# Patient Record
Sex: Female | Born: 1935 | Race: White | Hispanic: No | Marital: Single | Smoking: Never smoker
Health system: Southern US, Community
[De-identification: ages and names within clinical notes are randomized; demographics above are authoritative.]

## PROBLEM LIST (undated history)

## (undated) DIAGNOSIS — R32 Unspecified urinary incontinence: Secondary | ICD-10-CM

## (undated) DIAGNOSIS — M1990 Unspecified osteoarthritis, unspecified site: Secondary | ICD-10-CM

## (undated) DIAGNOSIS — C8590 Non-Hodgkin lymphoma, unspecified, unspecified site: Secondary | ICD-10-CM

## (undated) DIAGNOSIS — E785 Hyperlipidemia, unspecified: Secondary | ICD-10-CM

## (undated) DIAGNOSIS — I10 Essential (primary) hypertension: Secondary | ICD-10-CM

## (undated) DIAGNOSIS — Z8542 Personal history of malignant neoplasm of other parts of uterus: Secondary | ICD-10-CM

## (undated) DIAGNOSIS — I340 Nonrheumatic mitral (valve) insufficiency: Secondary | ICD-10-CM

## (undated) DIAGNOSIS — Z853 Personal history of malignant neoplasm of breast: Secondary | ICD-10-CM

## (undated) DIAGNOSIS — D649 Anemia, unspecified: Secondary | ICD-10-CM

## (undated) DIAGNOSIS — G4730 Sleep apnea, unspecified: Secondary | ICD-10-CM

## (undated) DIAGNOSIS — K219 Gastro-esophageal reflux disease without esophagitis: Secondary | ICD-10-CM

## (undated) DIAGNOSIS — E119 Type 2 diabetes mellitus without complications: Secondary | ICD-10-CM

## (undated) HISTORY — DX: Sleep apnea, unspecified: G47.30

## (undated) HISTORY — PX: APPENDECTOMY: SHX54

## (undated) HISTORY — DX: Unspecified urinary incontinence: R32

## (undated) HISTORY — PX: BREAST LUMPECTOMY: SHX2

## (undated) HISTORY — DX: Gastro-esophageal reflux disease without esophagitis: K21.9

## (undated) HISTORY — DX: Essential (primary) hypertension: I10

## (undated) HISTORY — DX: Non-Hodgkin lymphoma, unspecified, unspecified site: C85.90

## (undated) HISTORY — DX: Hyperlipidemia, unspecified: E78.5

## (undated) HISTORY — DX: Type 2 diabetes mellitus without complications: E11.9

## (undated) HISTORY — DX: Personal history of malignant neoplasm of other parts of uterus: Z85.42

## (undated) HISTORY — DX: Unspecified osteoarthritis, unspecified site: M19.90

## (undated) HISTORY — DX: Nonrheumatic mitral (valve) insufficiency: I34.0

## (undated) HISTORY — PX: TONSILLECTOMY: SUR1361

## (undated) HISTORY — DX: Anemia, unspecified: D64.9

## (undated) HISTORY — PX: CARDIAC CATHETERIZATION: SHX172

## (undated) HISTORY — DX: Personal history of malignant neoplasm of breast: Z85.3

## (undated) HISTORY — PX: TOTAL ABDOMINAL HYSTERECTOMY W/ BILATERAL SALPINGOOPHORECTOMY: SHX83

---

## 2000-03-17 HISTORY — PX: OTHER SURGICAL HISTORY: SHX169

## 2000-03-17 HISTORY — PX: CARPAL TUNNEL RELEASE: SHX101

## 2001-06-02 HISTORY — PX: ENDOSCOPIC INSERTION PERITONEAL CATHETER PORT: SUR440

## 2004-03-02 ENCOUNTER — Encounter: Payer: Self-pay | Admitting: Internal Medicine

## 2004-03-07 ENCOUNTER — Emergency Department: Payer: Self-pay | Admitting: Emergency Medicine

## 2004-03-13 ENCOUNTER — Ambulatory Visit: Payer: Self-pay | Admitting: Oncology

## 2004-03-18 ENCOUNTER — Ambulatory Visit: Payer: Self-pay | Admitting: Unknown Physician Specialty

## 2004-04-02 ENCOUNTER — Encounter: Payer: Self-pay | Admitting: Internal Medicine

## 2004-04-02 ENCOUNTER — Ambulatory Visit: Payer: Self-pay | Admitting: Oncology

## 2004-05-02 ENCOUNTER — Encounter: Payer: Self-pay | Admitting: Internal Medicine

## 2004-05-15 ENCOUNTER — Ambulatory Visit: Payer: Self-pay | Admitting: Oncology

## 2004-06-02 ENCOUNTER — Encounter: Payer: Self-pay | Admitting: Internal Medicine

## 2004-06-02 ENCOUNTER — Ambulatory Visit: Payer: Self-pay | Admitting: Oncology

## 2004-07-03 ENCOUNTER — Encounter: Payer: Self-pay | Admitting: Internal Medicine

## 2004-07-09 ENCOUNTER — Emergency Department: Payer: Self-pay | Admitting: General Practice

## 2004-07-11 ENCOUNTER — Ambulatory Visit: Payer: Self-pay | Admitting: Oncology

## 2004-07-31 ENCOUNTER — Ambulatory Visit: Payer: Self-pay | Admitting: Oncology

## 2004-07-31 ENCOUNTER — Encounter: Payer: Self-pay | Admitting: Internal Medicine

## 2004-08-31 ENCOUNTER — Encounter: Payer: Self-pay | Admitting: Internal Medicine

## 2004-08-31 ENCOUNTER — Ambulatory Visit: Payer: Self-pay | Admitting: Oncology

## 2004-09-30 ENCOUNTER — Encounter: Payer: Self-pay | Admitting: Internal Medicine

## 2004-09-30 ENCOUNTER — Ambulatory Visit: Payer: Self-pay | Admitting: Oncology

## 2004-10-07 ENCOUNTER — Ambulatory Visit: Payer: Self-pay | Admitting: General Surgery

## 2004-10-31 ENCOUNTER — Encounter: Payer: Self-pay | Admitting: Internal Medicine

## 2004-10-31 ENCOUNTER — Ambulatory Visit: Payer: Self-pay | Admitting: Oncology

## 2004-11-30 ENCOUNTER — Encounter: Payer: Self-pay | Admitting: Internal Medicine

## 2004-12-06 ENCOUNTER — Ambulatory Visit: Payer: Self-pay | Admitting: Oncology

## 2004-12-31 ENCOUNTER — Ambulatory Visit: Payer: Self-pay | Admitting: Oncology

## 2004-12-31 ENCOUNTER — Encounter: Payer: Self-pay | Admitting: Internal Medicine

## 2005-01-31 ENCOUNTER — Encounter: Payer: Self-pay | Admitting: Internal Medicine

## 2005-03-02 ENCOUNTER — Encounter: Payer: Self-pay | Admitting: Internal Medicine

## 2005-03-05 ENCOUNTER — Ambulatory Visit: Payer: Self-pay | Admitting: Oncology

## 2005-04-02 ENCOUNTER — Encounter: Payer: Self-pay | Admitting: Internal Medicine

## 2005-04-02 ENCOUNTER — Ambulatory Visit: Payer: Self-pay | Admitting: Oncology

## 2005-05-02 ENCOUNTER — Ambulatory Visit: Payer: Self-pay | Admitting: Oncology

## 2005-05-02 ENCOUNTER — Encounter: Payer: Self-pay | Admitting: Internal Medicine

## 2005-06-02 ENCOUNTER — Encounter: Payer: Self-pay | Admitting: Internal Medicine

## 2005-06-13 ENCOUNTER — Ambulatory Visit: Payer: Self-pay | Admitting: Oncology

## 2005-06-17 ENCOUNTER — Ambulatory Visit: Payer: Self-pay | Admitting: Oncology

## 2005-07-03 ENCOUNTER — Encounter: Payer: Self-pay | Admitting: Internal Medicine

## 2005-07-04 ENCOUNTER — Ambulatory Visit: Payer: Self-pay | Admitting: Oncology

## 2005-07-31 ENCOUNTER — Encounter: Payer: Self-pay | Admitting: Internal Medicine

## 2005-07-31 ENCOUNTER — Ambulatory Visit: Payer: Self-pay | Admitting: Oncology

## 2005-08-31 ENCOUNTER — Encounter: Payer: Self-pay | Admitting: Internal Medicine

## 2005-09-10 ENCOUNTER — Ambulatory Visit: Payer: Self-pay | Admitting: Oncology

## 2005-09-15 ENCOUNTER — Emergency Department: Payer: Self-pay | Admitting: Emergency Medicine

## 2005-10-02 ENCOUNTER — Ambulatory Visit: Payer: Self-pay | Admitting: General Surgery

## 2005-10-03 ENCOUNTER — Encounter: Payer: Self-pay | Admitting: Internal Medicine

## 2005-10-08 ENCOUNTER — Ambulatory Visit: Payer: Self-pay | Admitting: Oncology

## 2005-10-16 ENCOUNTER — Ambulatory Visit: Payer: Self-pay | Admitting: General Surgery

## 2005-10-31 ENCOUNTER — Ambulatory Visit: Payer: Self-pay | Admitting: Oncology

## 2005-10-31 ENCOUNTER — Encounter: Payer: Self-pay | Admitting: Internal Medicine

## 2005-11-30 ENCOUNTER — Encounter: Payer: Self-pay | Admitting: Internal Medicine

## 2005-11-30 ENCOUNTER — Ambulatory Visit: Payer: Self-pay | Admitting: Oncology

## 2005-12-31 ENCOUNTER — Encounter: Payer: Self-pay | Admitting: Internal Medicine

## 2005-12-31 ENCOUNTER — Ambulatory Visit: Payer: Self-pay | Admitting: Oncology

## 2006-01-31 ENCOUNTER — Encounter: Payer: Self-pay | Admitting: Internal Medicine

## 2006-01-31 ENCOUNTER — Ambulatory Visit: Payer: Self-pay | Admitting: Oncology

## 2006-02-20 ENCOUNTER — Ambulatory Visit: Payer: Self-pay | Admitting: Oncology

## 2006-03-02 ENCOUNTER — Encounter: Payer: Self-pay | Admitting: Internal Medicine

## 2006-03-02 ENCOUNTER — Ambulatory Visit: Payer: Self-pay | Admitting: Oncology

## 2006-04-02 ENCOUNTER — Encounter: Payer: Self-pay | Admitting: Internal Medicine

## 2006-04-09 ENCOUNTER — Ambulatory Visit: Payer: Self-pay | Admitting: Oncology

## 2006-05-02 ENCOUNTER — Ambulatory Visit: Payer: Self-pay | Admitting: Oncology

## 2006-06-02 ENCOUNTER — Ambulatory Visit: Payer: Self-pay | Admitting: Oncology

## 2006-08-01 ENCOUNTER — Ambulatory Visit: Payer: Self-pay | Admitting: Oncology

## 2006-08-26 ENCOUNTER — Ambulatory Visit: Payer: Self-pay | Admitting: Oncology

## 2006-09-01 ENCOUNTER — Ambulatory Visit: Payer: Self-pay | Admitting: Oncology

## 2006-10-08 ENCOUNTER — Ambulatory Visit: Payer: Self-pay | Admitting: Oncology

## 2006-10-14 ENCOUNTER — Ambulatory Visit: Payer: Self-pay | Admitting: General Surgery

## 2006-11-01 ENCOUNTER — Ambulatory Visit: Payer: Self-pay | Admitting: Oncology

## 2006-12-01 ENCOUNTER — Ambulatory Visit: Payer: Self-pay | Admitting: Oncology

## 2007-01-01 ENCOUNTER — Ambulatory Visit: Payer: Self-pay | Admitting: Oncology

## 2007-02-01 ENCOUNTER — Ambulatory Visit: Payer: Self-pay | Admitting: Oncology

## 2007-02-26 ENCOUNTER — Ambulatory Visit: Payer: Self-pay | Admitting: Oncology

## 2007-03-03 ENCOUNTER — Ambulatory Visit: Payer: Self-pay | Admitting: Oncology

## 2007-04-03 ENCOUNTER — Ambulatory Visit: Payer: Self-pay | Admitting: Oncology

## 2007-05-03 ENCOUNTER — Ambulatory Visit: Payer: Self-pay | Admitting: Oncology

## 2007-06-03 ENCOUNTER — Ambulatory Visit: Payer: Self-pay | Admitting: Oncology

## 2007-07-04 ENCOUNTER — Ambulatory Visit: Payer: Self-pay | Admitting: Oncology

## 2007-08-01 ENCOUNTER — Ambulatory Visit: Payer: Self-pay | Admitting: Oncology

## 2007-09-01 ENCOUNTER — Ambulatory Visit: Payer: Self-pay | Admitting: Oncology

## 2007-09-16 ENCOUNTER — Ambulatory Visit: Payer: Self-pay | Admitting: Oncology

## 2007-10-01 ENCOUNTER — Ambulatory Visit: Payer: Self-pay | Admitting: Oncology

## 2007-11-01 ENCOUNTER — Ambulatory Visit: Payer: Self-pay | Admitting: General Surgery

## 2007-11-12 ENCOUNTER — Ambulatory Visit: Payer: Self-pay | Admitting: Oncology

## 2007-12-01 ENCOUNTER — Ambulatory Visit: Payer: Self-pay | Admitting: Oncology

## 2008-01-01 ENCOUNTER — Ambulatory Visit: Payer: Self-pay | Admitting: Oncology

## 2008-02-01 ENCOUNTER — Ambulatory Visit: Payer: Self-pay | Admitting: Oncology

## 2008-02-17 ENCOUNTER — Ambulatory Visit: Payer: Self-pay | Admitting: Oncology

## 2008-02-21 ENCOUNTER — Ambulatory Visit: Payer: Self-pay | Admitting: Oncology

## 2008-03-02 ENCOUNTER — Ambulatory Visit: Payer: Self-pay | Admitting: Oncology

## 2008-07-19 ENCOUNTER — Observation Stay: Payer: Self-pay | Admitting: General Surgery

## 2008-07-31 ENCOUNTER — Ambulatory Visit: Payer: Self-pay | Admitting: Oncology

## 2008-08-21 ENCOUNTER — Ambulatory Visit: Payer: Self-pay | Admitting: Oncology

## 2008-08-31 ENCOUNTER — Ambulatory Visit: Payer: Self-pay | Admitting: Oncology

## 2008-10-31 ENCOUNTER — Ambulatory Visit: Payer: Self-pay | Admitting: Nurse Practitioner

## 2008-11-08 ENCOUNTER — Ambulatory Visit: Payer: Self-pay | Admitting: General Surgery

## 2008-11-27 ENCOUNTER — Ambulatory Visit: Payer: Self-pay | Admitting: Oncology

## 2008-11-30 ENCOUNTER — Ambulatory Visit: Payer: Self-pay | Admitting: Oncology

## 2008-11-30 ENCOUNTER — Ambulatory Visit: Payer: Self-pay | Admitting: Nurse Practitioner

## 2009-04-02 ENCOUNTER — Ambulatory Visit: Payer: Self-pay | Admitting: Oncology

## 2009-04-19 ENCOUNTER — Ambulatory Visit: Payer: Self-pay | Admitting: Oncology

## 2009-05-02 ENCOUNTER — Ambulatory Visit: Payer: Self-pay | Admitting: Oncology

## 2009-06-02 ENCOUNTER — Ambulatory Visit: Payer: Self-pay | Admitting: Oncology

## 2009-07-23 ENCOUNTER — Emergency Department: Payer: Self-pay | Admitting: Emergency Medicine

## 2009-07-27 ENCOUNTER — Ambulatory Visit: Payer: Self-pay | Admitting: Orthopedic Surgery

## 2009-07-31 ENCOUNTER — Ambulatory Visit: Payer: Self-pay | Admitting: Oncology

## 2009-08-30 ENCOUNTER — Ambulatory Visit: Payer: Self-pay | Admitting: Oncology

## 2009-08-31 ENCOUNTER — Ambulatory Visit: Payer: Self-pay | Admitting: Oncology

## 2009-12-04 ENCOUNTER — Ambulatory Visit: Payer: Self-pay | Admitting: General Surgery

## 2009-12-31 ENCOUNTER — Ambulatory Visit: Payer: Self-pay | Admitting: Oncology

## 2010-01-03 ENCOUNTER — Ambulatory Visit: Payer: Self-pay | Admitting: Oncology

## 2010-01-31 ENCOUNTER — Ambulatory Visit: Payer: Self-pay | Admitting: Oncology

## 2010-05-06 ENCOUNTER — Ambulatory Visit: Payer: Self-pay | Admitting: Oncology

## 2010-06-02 ENCOUNTER — Ambulatory Visit: Payer: Self-pay | Admitting: Oncology

## 2010-11-05 ENCOUNTER — Ambulatory Visit: Payer: Self-pay | Admitting: Oncology

## 2010-12-01 ENCOUNTER — Ambulatory Visit: Payer: Self-pay | Admitting: Oncology

## 2011-08-05 ENCOUNTER — Ambulatory Visit: Payer: Self-pay | Admitting: Oncology

## 2011-08-05 LAB — CBC CANCER CENTER
Basophil %: 0.8 %
Eosinophil %: 1.1 %
HCT: 39.3 % (ref 35.0–47.0)
HGB: 13.1 g/dL (ref 12.0–16.0)
Lymphocyte %: 13.4 %
MCH: 28.2 pg (ref 26.0–34.0)
MCHC: 33.3 g/dL (ref 32.0–36.0)
Monocyte #: 0.5 x10 3/mm (ref 0.0–0.7)
Monocyte %: 5.7 %
Neutrophil #: 6.5 x10 3/mm (ref 1.4–6.5)
Neutrophil %: 79 %
RBC: 4.65 10*6/uL (ref 3.80–5.20)
WBC: 8.3 x10 3/mm (ref 3.6–11.0)

## 2011-08-05 LAB — COMPREHENSIVE METABOLIC PANEL
Albumin: 3.5 g/dL (ref 3.4–5.0)
Anion Gap: 7 (ref 7–16)
Calcium, Total: 8.8 mg/dL (ref 8.5–10.1)
Chloride: 103 mmol/L (ref 98–107)
EGFR (African American): >60
EGFR (Non-African Amer.): 57 — ABNORMAL LOW
Osmolality: 284 (ref 275–301)
Potassium: 4.6 mmol/L (ref 3.5–5.1)
SGOT(AST): 16 U/L (ref 15–37)

## 2011-09-01 ENCOUNTER — Ambulatory Visit: Payer: Self-pay | Admitting: Oncology

## 2011-09-05 ENCOUNTER — Inpatient Hospital Stay: Payer: Self-pay | Admitting: Surgery

## 2011-09-05 LAB — CBC
HCT: 42.4 % (ref 35.0–47.0)
MCH: 27.2 pg (ref 26.0–34.0)
MCHC: 32.3 g/dL (ref 32.0–36.0)
Platelet: 224 10*3/uL (ref 150–440)
RDW: 14.2 % (ref 11.5–14.5)
WBC: 10.8 10*3/uL (ref 3.6–11.0)

## 2011-09-05 LAB — COMPREHENSIVE METABOLIC PANEL
BUN: 15 mg/dL (ref 7–18)
Bilirubin,Total: 0.8 mg/dL (ref 0.2–1.0)
Calcium, Total: 8.8 mg/dL (ref 8.5–10.1)
Chloride: 103 mmol/L (ref 98–107)
Co2: 28 mmol/L (ref 21–32)
Creatinine: 0.78 mg/dL (ref 0.60–1.30)
EGFR (Non-African Amer.): >60
Glucose: 125 mg/dL — ABNORMAL HIGH (ref 65–99)
Osmolality: 282 (ref 275–301)
Potassium: 3.9 mmol/L (ref 3.5–5.1)
SGOT(AST): 21 U/L (ref 15–37)
SGPT (ALT): 14 U/L
Sodium: 140 mmol/L (ref 136–145)
Total Protein: 7 g/dL (ref 6.4–8.2)

## 2011-09-05 LAB — LIPASE, BLOOD: Lipase: 79 U/L (ref 73–393)

## 2011-09-05 LAB — TROPONIN I: Troponin-I: <0.02 ng/mL

## 2011-09-11 DIAGNOSIS — R32 Unspecified urinary incontinence: Secondary | ICD-10-CM

## 2011-09-11 DIAGNOSIS — K439 Ventral hernia without obstruction or gangrene: Secondary | ICD-10-CM

## 2011-09-11 DIAGNOSIS — E119 Type 2 diabetes mellitus without complications: Secondary | ICD-10-CM

## 2011-09-11 DIAGNOSIS — I10 Essential (primary) hypertension: Secondary | ICD-10-CM

## 2011-09-12 DIAGNOSIS — F411 Generalized anxiety disorder: Secondary | ICD-10-CM

## 2011-09-12 DIAGNOSIS — F329 Major depressive disorder, single episode, unspecified: Secondary | ICD-10-CM

## 2011-09-14 ENCOUNTER — Observation Stay: Payer: Self-pay | Admitting: Emergency Medicine

## 2011-09-14 LAB — URINALYSIS, COMPLETE
Ketone: NEGATIVE
Nitrite: POSITIVE
Ph: 5 (ref 4.5–8.0)
Specific Gravity: 1.021 (ref 1.003–1.030)
Squamous Epithelial: 2

## 2011-09-14 LAB — LIPASE, BLOOD: Lipase: 209 U/L (ref 73–393)

## 2011-09-14 LAB — COMPREHENSIVE METABOLIC PANEL
Anion Gap: 10 (ref 7–16)
BUN: 22 mg/dL — ABNORMAL HIGH (ref 7–18)
Co2: 28 mmol/L (ref 21–32)
Creatinine: 0.98 mg/dL (ref 0.60–1.30)
EGFR (African American): >60
EGFR (Non-African Amer.): 54 — ABNORMAL LOW
Glucose: 158 mg/dL — ABNORMAL HIGH (ref 65–99)
Potassium: 4.6 mmol/L (ref 3.5–5.1)
Sodium: 139 mmol/L (ref 136–145)
Total Protein: 7.2 g/dL (ref 6.4–8.2)

## 2011-09-14 LAB — CBC
HGB: 13.1 g/dL (ref 12.0–16.0)
MCH: 27.7 pg (ref 26.0–34.0)
MCV: 85 fL (ref 80–100)
RDW: 14.2 % (ref 11.5–14.5)
WBC: 14.5 10*3/uL — ABNORMAL HIGH (ref 3.6–11.0)

## 2011-09-15 ENCOUNTER — Telehealth: Payer: Self-pay | Admitting: Internal Medicine

## 2011-09-15 NOTE — Telephone Encounter (Signed)
Triage Record Num: [id number] Operator: Migdalia Dk Patient Name: Sarah Orozco Call Date & Time: 09/14/2011 4:25:42AM Patient Phone: [phone] PCP: Tillman Abide Patient Gender: Female PCP Fax : [phone] Patient DOB: [birth year] Practice Name: [facility] Reason for Call: Caller: Rozell Searing; PCP: Tillman Abide I.; CB#: [phone]; Call regarding Vomiting ; diarrhea; chest pain; C/o chest pain, also with vomiting and diarrhea. Nurse to send pt. to ER for evaluation. Protocol(s) Used: Chest Pain Recommended Outcome per Protocol: Activate EMS 911 Reason for Outcome: Pressure, fullness, squeezing sensation or pain anywhere in the chest lasting 5 or more minutes now or within the last hour. Pain is NOT associated with taking a deep breath or a productive cough, movement, or touch to a localized area on the chest. Care Advice: ~ 09/14/2011 4:28:49AM Page 1 of 1 [id number]

## 2011-09-15 NOTE — Telephone Encounter (Signed)
Was admitted  Will await further info from [facility]

## 2011-09-18 DIAGNOSIS — R1084 Generalized abdominal pain: Secondary | ICD-10-CM

## 2011-09-18 DIAGNOSIS — F411 Generalized anxiety disorder: Secondary | ICD-10-CM

## 2011-09-24 DIAGNOSIS — I10 Essential (primary) hypertension: Secondary | ICD-10-CM

## 2011-10-01 ENCOUNTER — Ambulatory Visit: Payer: Self-pay | Admitting: Surgery

## 2011-10-21 ENCOUNTER — Ambulatory Visit: Payer: Self-pay | Admitting: Surgery

## 2011-10-23 DIAGNOSIS — R070 Pain in throat: Secondary | ICD-10-CM

## 2011-11-03 LAB — PATHOLOGY REPORT

## 2011-12-04 ENCOUNTER — Emergency Department: Payer: Self-pay | Admitting: Emergency Medicine

## 2011-12-05 LAB — URINALYSIS, COMPLETE
Bilirubin,UR: NEGATIVE
Glucose,UR: NEGATIVE mg/dL (ref 0–75)
Ph: 5 (ref 4.5–8.0)
Protein: NEGATIVE
RBC,UR: 2 /HPF (ref 0–5)
Specific Gravity: 1.014 (ref 1.003–1.030)
Squamous Epithelial: 1
WBC UR: 100 /HPF (ref 0–5)

## 2011-12-05 LAB — COMPREHENSIVE METABOLIC PANEL
Anion Gap: 6 — ABNORMAL LOW (ref 7–16)
Calcium, Total: 8.9 mg/dL (ref 8.5–10.1)
Chloride: 100 mmol/L (ref 98–107)
EGFR (African American): >60
EGFR (Non-African Amer.): >60
Glucose: 159 mg/dL — ABNORMAL HIGH (ref 65–99)
Potassium: 4.3 mmol/L (ref 3.5–5.1)
SGOT(AST): 16 U/L (ref 15–37)
Total Protein: 6.8 g/dL (ref 6.4–8.2)

## 2011-12-05 LAB — CBC
HCT: 36.6 % (ref 35.0–47.0)
MCH: 26.6 pg (ref 26.0–34.0)
MCHC: 32 g/dL (ref 32.0–36.0)
MCV: 83 fL (ref 80–100)
Platelet: 228 10*3/uL (ref 150–440)
RDW: 15 % — ABNORMAL HIGH (ref 11.5–14.5)
WBC: 12.3 10*3/uL — ABNORMAL HIGH (ref 3.6–11.0)

## 2011-12-07 LAB — URINE CULTURE

## 2011-12-07 LAB — STOOL CULTURE

## 2011-12-29 ENCOUNTER — Ambulatory Visit: Payer: Self-pay | Admitting: Oncology

## 2011-12-29 LAB — COMPREHENSIVE METABOLIC PANEL
Albumin: 3.2 g/dL — ABNORMAL LOW (ref 3.4–5.0)
Alkaline Phosphatase: 103 U/L (ref 50–136)
Calcium, Total: 9.2 mg/dL (ref 8.5–10.1)
Co2: 30 mmol/L (ref 21–32)
EGFR (African American): >60
Glucose: 190 mg/dL — ABNORMAL HIGH (ref 65–99)
SGOT(AST): 19 U/L (ref 15–37)
SGPT (ALT): 25 U/L

## 2011-12-29 LAB — CBC CANCER CENTER
Basophil #: 0 x10 3/mm (ref 0.0–0.1)
Eosinophil #: 0.2 x10 3/mm (ref 0.0–0.7)
Lymphocyte #: 1.2 x10 3/mm (ref 1.0–3.6)
MCH: 26.5 pg (ref 26.0–34.0)
MCV: 82 fL (ref 80–100)
Monocyte #: 0.5 x10 3/mm (ref 0.2–0.9)
Monocyte %: 6 %
Platelet: 163 x10 3/mm (ref 150–440)
RDW: 15.2 % — ABNORMAL HIGH (ref 11.5–14.5)
WBC: 8.7 x10 3/mm (ref 3.6–11.0)

## 2012-01-01 ENCOUNTER — Ambulatory Visit: Payer: Self-pay | Admitting: Oncology

## 2012-01-02 ENCOUNTER — Ambulatory Visit: Payer: Self-pay | Admitting: Physician Assistant

## 2012-01-03 ENCOUNTER — Ambulatory Visit: Payer: Self-pay | Admitting: Oncology

## 2012-02-16 ENCOUNTER — Other Ambulatory Visit: Payer: Self-pay | Admitting: *Deleted

## 2012-02-16 NOTE — Telephone Encounter (Signed)
I think she was at [facility] but already left They need to send any requests to her usual primary care doc

## 2012-02-16 NOTE — Telephone Encounter (Signed)
Faxed request asking for [facility], is this your patient?

## 2012-02-17 NOTE — Telephone Encounter (Signed)
ok 

## 2012-03-03 ENCOUNTER — Ambulatory Visit: Payer: Self-pay | Admitting: Internal Medicine

## 2012-06-02 ENCOUNTER — Ambulatory Visit: Payer: Self-pay | Admitting: Oncology

## 2012-10-21 ENCOUNTER — Ambulatory Visit: Payer: Self-pay | Admitting: Surgery

## 2012-11-01 ENCOUNTER — Ambulatory Visit: Payer: Self-pay | Admitting: Oncology

## 2013-06-01 ENCOUNTER — Ambulatory Visit: Payer: Self-pay | Admitting: Ophthalmology

## 2013-06-01 DIAGNOSIS — I10 Essential (primary) hypertension: Secondary | ICD-10-CM

## 2013-06-30 ENCOUNTER — Ambulatory Visit: Payer: Self-pay | Admitting: Ophthalmology

## 2013-06-30 LAB — POTASSIUM: Potassium: 4 mmol/L (ref 3.5–5.1)

## 2013-07-04 ENCOUNTER — Ambulatory Visit: Payer: Self-pay | Admitting: Ophthalmology

## 2013-08-11 ENCOUNTER — Ambulatory Visit: Payer: Self-pay | Admitting: Physician Assistant

## 2013-12-09 ENCOUNTER — Encounter: Payer: Self-pay | Admitting: *Deleted

## 2013-12-12 ENCOUNTER — Encounter: Payer: Self-pay | Admitting: *Deleted

## 2013-12-12 ENCOUNTER — Ambulatory Visit: Payer: PRIVATE HEALTH INSURANCE | Admitting: Cardiovascular Disease

## 2014-02-27 ENCOUNTER — Ambulatory Visit: Payer: Self-pay | Admitting: Physician Assistant

## 2014-03-10 ENCOUNTER — Ambulatory Visit: Payer: PRIVATE HEALTH INSURANCE | Admitting: Cardiovascular Disease

## 2014-03-16 ENCOUNTER — Emergency Department: Payer: Self-pay | Admitting: Internal Medicine

## 2014-06-16 ENCOUNTER — Ambulatory Visit: Payer: PRIVATE HEALTH INSURANCE | Admitting: Cardiovascular Disease

## 2014-07-03 ENCOUNTER — Encounter: Payer: Self-pay | Admitting: Cardiovascular Disease

## 2014-07-03 ENCOUNTER — Ambulatory Visit (INDEPENDENT_AMBULATORY_CARE_PROVIDER_SITE_OTHER): Payer: Medicare Other | Admitting: Cardiovascular Disease

## 2014-07-03 ENCOUNTER — Encounter (INDEPENDENT_AMBULATORY_CARE_PROVIDER_SITE_OTHER): Payer: Self-pay

## 2014-07-03 DIAGNOSIS — E1165 Type 2 diabetes mellitus with hyperglycemia: Secondary | ICD-10-CM | POA: Insufficient documentation

## 2014-07-03 DIAGNOSIS — I10 Essential (primary) hypertension: Secondary | ICD-10-CM

## 2014-07-03 DIAGNOSIS — I5032 Chronic diastolic (congestive) heart failure: Secondary | ICD-10-CM

## 2014-07-03 DIAGNOSIS — R0602 Shortness of breath: Secondary | ICD-10-CM

## 2014-07-03 DIAGNOSIS — G4733 Obstructive sleep apnea (adult) (pediatric): Secondary | ICD-10-CM

## 2014-07-03 DIAGNOSIS — I159 Secondary hypertension, unspecified: Secondary | ICD-10-CM

## 2014-07-03 DIAGNOSIS — E119 Type 2 diabetes mellitus without complications: Secondary | ICD-10-CM

## 2014-07-03 DIAGNOSIS — R32 Unspecified urinary incontinence: Secondary | ICD-10-CM

## 2014-07-03 MED ORDER — HYDROCHLOROTHIAZIDE 25 MG PO TABS: 25.0000 mg | ORAL_TABLET | Freq: Every day | ORAL | Status: DC

## 2014-07-03 NOTE — Assessment & Plan Note (Signed)
Currently not on her see Pap. Prior echocardiogram with mild pulmonary hypertension. We'll start HCTZ

## 2014-07-03 NOTE — Assessment & Plan Note (Signed)
Chronic shortness of breath. She is sedentary, deconditioned, obese. Also likely has chronic diastolic CHF.

## 2014-07-03 NOTE — Progress Notes (Signed)
Patient ID: Sarah Orozco, female    DOB: [birth year], 81 y.o.   MRN: [id number]  HPI Comments: Sarah Orozco is a pleasant 81 year old woman with history of normal ejection fraction, chronic lower extremity edema, hypertension, incontinence and wears a diaper, diabetes, hyperlipidemia, breast cancer and uterine cancer, obstructive sleep apnea, non-Hodgkin's lymphoma with bulky disease of the right subclavian region who presents to establish care in the [place] office.  She reports that she is unable to walk very far. She can walk short distances with a walker but needs assistance. She has chronic lower extremity edema but this has been stable. She is able to work with physical therapy but reports recently blood pressure has been excessively high and on those occasions they will not exercise her. She does not take a diuretic and has not done so for quite some time. Notes from cardiology in July 2014 shows she was on Lasix 20 mg daily. She wears compression hose daily, significant ankle tenderness Patient believes she has diabetes but is currently not on any diabetes medications. Hemoglobin A1c in mid 2015 was 7.8  Prior echocardiogram June 2014 showing normal LV systolic function, significant mitral, tricuspid regurgitation, biatrial enlargement, study read by outside cardiologist  Blood pressure has been running high, typically 150s up to 867E systolic EKG shows normal sinus rhythm with rate 66 bpm, no significant ST or T-wave changes last cholesterol measurements 186, LDL 96, triglycerides 264, glucose level 248      No Known Allergies  Outpatient Encounter Prescriptions as of 07/03/2014  Medication Sig  . acetaminophen (TYLENOL) 325 MG tablet Take 325 mg by mouth every 6 (six) hours as needed.   . ALPRAZolam (XANAX) 0.25 MG tablet Take 0.25 mg by mouth 2 (two) times daily as needed for anxiety.  Marland Kitchen amoxicillin (AMOXIL) 500 MG capsule Take 4 tablets 1 hour prior to dental procedures.  .  Calcium Carbonate-Vitamin D (CALCIUM 600+D) 600-400 MG-UNIT per tablet Take 1 tablet by mouth 2 (two) times daily.  . Elastic Bandages & Supports (T.E.D. BELOW KNEE/S-REGULAR) MISC by Does not apply route.  Marland Kitchen losartan (COZAAR) 50 MG tablet Take 50 mg by mouth.   . Lutein 6 MG CAPS Take by mouth.  . meloxicam (MOBIC) 7.5 MG tablet Take 7.5 mg by mouth daily.  . miconazole (MICOTIN) 2 % cream Apply 1 application topically 2 (two) times daily.  . Multiple Vitamins-Minerals (THERAGRAN-M PREMIER 50 PLUS PO) Take by mouth daily.  Marland Kitchen Petrolatum-Zinc Oxide 49-15 % OINT Apply topically 2 (two) times daily.  . sertraline (ZOLOFT) 100 MG tablet Take 100 mg by mouth daily.  . [DISCONTINUED] traMADol (ULTRAM) 50 MG tablet Take by mouth every 6 (six) hours as needed.  . hydrochlorothiazide (HYDRODIURIL) 25 MG tablet Take 1 tablet (25 mg total) by mouth daily.  . [DISCONTINUED] Calcium Carb-Cholecalciferol (CALTRATE 600+D) 600-800 MG-UNIT TABS Take by mouth 2 (two) times daily.  . [DISCONTINUED] ciprofloxacin (CIPRO) 250 MG tablet Take 250 mg by mouth 2 (two) times daily.  . [DISCONTINUED] furosemide (LASIX) 20 MG tablet Take 20 mg by mouth 2 (two) times daily.  . [DISCONTINUED] glipiZIDE (GLUCOTROL XL) 2.5 MG 24 hr tablet Take 2.5 mg by mouth daily with breakfast.  . [DISCONTINUED] losartan (COZAAR) 25 MG tablet Take 25 mg by mouth daily.  . [DISCONTINUED] oxybutynin (DITROPAN) 5 MG tablet Take 5 mg by mouth 2 (two) times daily.  . [DISCONTINUED] potassium chloride (K-DUR) 10 MEQ tablet Take 10 mEq by mouth daily.    Past  Medical History  Diagnosis Date  . Mitral valve insufficiency   . Diabetes mellitus without complication   . Sleep apnea   . Hyperlipidemia   . Hypertension   . History of breast cancer   . Anemia   . Urinary incontinence   . Osteoarthritis   . GERD (gastroesophageal reflux disease)   . History of uterine cancer   . Non-Hodgkin's lymphoma   . Incontinence     Past Surgical  History  Procedure Laterality Date  . Total abdominal hysterectomy w/ bilateral salpingoophorectomy    . Breast lumpectomy      left   . Tonsillectomy    . Appendectomy    . Knee replacement  03/17/2000    right knee   . Carpal tunnel release  03/17/2000  . Endoscopic insertion peritoneal catheter port  2003  . Cardiac catheterization      Social History  reports that she has never smoked. She does not have any smokeless tobacco history on file. She reports that she does not drink alcohol or use illicit drugs.  Family History Family history is unknown by patient.   Review of Systems  Constitutional: Negative.   Respiratory: Negative.   Cardiovascular: Negative.   Gastrointestinal: Negative.   Musculoskeletal: Negative.   Allergic/Immunologic: Negative.   Neurological: Negative.   Hematological: Negative.   Psychiatric/Behavioral: Negative.   All other systems reviewed and are negative.   BP 160/80 mmHg  Pulse 66  Ht 5' 4.5" (1.638 m)  Wt 206 lb (93.441 kg)  BMI 34.83 kg/m2  Physical Exam  Constitutional: She is oriented to person, place, and time. She appears well-developed and well-nourished.  HENT:  Head: Normocephalic.  Nose: Nose normal.  Mouth/Throat: Oropharynx is clear and moist.  Eyes: Conjunctivae are normal. Pupils are equal, round, and reactive to light.  Neck: Normal range of motion. Neck supple. No JVD present.  Cardiovascular: Normal rate, regular rhythm, S1 normal, S2 normal, normal heart sounds and intact distal pulses.  Exam reveals no gallop and no friction rub.   No murmur heard. Pulmonary/Chest: Effort normal and breath sounds normal. No respiratory distress. She has no wheezes. She has no rales. She exhibits no tenderness.  Abdominal: Soft. Bowel sounds are normal. She exhibits no distension. There is no tenderness.  Musculoskeletal: Normal range of motion. She exhibits no edema or tenderness.  Lymphadenopathy:    She has no cervical  adenopathy.  Neurological: She is alert and oriented to person, place, and time. Coordination normal.  Skin: Skin is warm and dry. No rash noted. No erythema.  Psychiatric: She has a normal mood and affect. Her behavior is normal. Judgment and thought content normal.    Assessment and Plan  Nursing note and vitals reviewed.

## 2014-07-03 NOTE — Assessment & Plan Note (Signed)
Blood pressure is elevated, also mild lower extremity edema, abdominal bloating. She used to be on Lasix 20 mg daily. Recommended she start HCTZ 25 mg daily. We'll check basic metabolic panel in 3 weeks time.

## 2014-07-03 NOTE — Assessment & Plan Note (Signed)
Blood pressure continues to run 440 up to 347 systolic on a regular basis. We'll start HCTZ. We'll avoid calcium channel blockers. She is already taking losartan. This could be increased up to 100 mg daily if needed.

## 2014-07-03 NOTE — Patient Instructions (Addendum)
Please stop the tramadol, (side effects) Please start HCTZ 25 mg once a day  We will order BMP in 3 weeks  Lab work today (HBA1C, BMP)  Please call the office if blood pressure continues to run high  Please call us if you have new issues that need to be addressed before your next appt.  Your physician wants you to follow-up in: 3 months.  You will receive a reminder letter in the mail two months in advance. If you don't receive a letter, please call our office to schedule the follow-up appointment.

## 2014-07-03 NOTE — Assessment & Plan Note (Signed)
She is unable to exercise. Recommended diet changes, portion control

## 2014-07-03 NOTE — Assessment & Plan Note (Signed)
She reports that she was previously on glipizide. 6 months ago hemoglobin A1c 7.8. We will recheck labs today

## 2014-07-04 LAB — BASIC METABOLIC PANEL
BUN/Creatinine Ratio: 23 (ref 11–26)
BUN: 19 mg/dL (ref 8–27)
CO2: 27 mmol/L (ref 18–29)
Calcium: 9.7 mg/dL (ref 8.7–10.3)
Chloride: 98 mmol/L (ref 97–108)
Creatinine, Ser: 0.82 mg/dL (ref 0.57–1.00)
GFR calc Af Amer: 76 mL/min/{1.73_m2} (ref >59)
GFR, EST NON AFRICAN AMERICAN: 66 mL/min/{1.73_m2} (ref >59)
Glucose: 158 mg/dL — ABNORMAL HIGH (ref 65–99)
Potassium: 4.6 mmol/L (ref 3.5–5.2)
Sodium: 141 mmol/L (ref 134–144)

## 2014-07-04 LAB — HEMOGLOBIN A1C
Est. average glucose Bld gHb Est-mCnc: 166 mg/dL
HEMOGLOBIN A1C: 7.4 % — AB (ref 4.8–5.6)

## 2014-09-23 NOTE — Op Note (Signed)
PATIENT NAME:  Sarah Orozco, Sarah Orozco MR#:  [id number] DATE OF BIRTH:  13-Mar-1928  DATE OF PROCEDURE:  07/04/2013  PREOPERATIVE DIAGNOSIS: Cataract, left eye.   POSTOPERATIVE DIAGNOSIS: Cataract, left eye.   PROCEDURE PERFORMED: Extracapsular cataract extraction using phacoemulsification with placement Alcon [id number], 21.0-diopter posterior chamber lens, serial number [id number].   SURGEON: Loura Back. Johnthan Axtman, M.D.   ANESTHESIA: 4% lidocaine and 0.75% Marcaine a 50-50 mixture with 10 units/mL of HyoMax added, given as a peribulbar.   ANESTHESIOLOGIST: Dr. Ronelle Nigh.   COMPLICATIONS: None.   ESTIMATED BLOOD LOSS: Less than 1 mL.   DESCRIPTION OF PROCEDURE: The patient was brought to the operating room and given a peribulbar block.  The patient was then prepped and draped in the usual fashion.  The vertical rectus muscles were imbricated using 5-0 silk sutures.  These sutures were then clamped to the sterile drapes as bridle sutures.  A limbal peritomy was performed extending two clock hours and hemostasis was obtained with cautery.  A partial thickness scleral groove was made at the surgical limbus and dissected anteriorly in a lamellar dissection using an Alcon crescent knife.  The anterior chamber was entered supero-temporally with a Superblade and through the lamellar dissection with a 2.6 mm keratome.  DisCoVisc was used to replace the aqueous and a continuous tear capsulorrhexis was carried out.  Hydrodissection and hydrodelineation were carried out with balanced salt and a 27 gauge canula.  The nucleus was rotated to confirm the effectiveness of the hydrodissection.  Phacoemulsification was carried out using a divide-and-conquer technique.  Total ultrasound time was 1 minute and 10.5 seconds with an average power of  24.7%. CDE 27.23.  Irrigation/aspiration was used to remove the residual cortex.  DisCoVisc was used to inflate the capsule and the internal incision was enlarged to 3 mm with the  crescent knife.  The intraocular lens was folded and inserted into the capsular bag using the AcrySert delivery system.  Irrigation/aspiration was used to remove the residual DisCoVisc.  Miostat was injected into the anterior chamber through the paracentesis track to inflate the anterior chamber and induce miosis.  A tenth of a mL of cefuroxime was injected via the paracentesis track containing 1 mg of drug. The wound was checked for leaks and none were found. The conjunctiva was closed with cautery and the bridle sutures were removed.  Two drops of 0.3% Vigamox were placed on the eye.   An eye shield was placed on the eye.  The patient was discharged to the recovery room in good condition.  ____________________________ Loura Back Dijon Kohlman, MD sad:aw D: 07/04/2013 12:40:52 ET T: 07/04/2013 14:24:29 ET JOB#: [id number]  cc: Remo Lipps A. Sharmain Lastra, MD, <Dictator> Martie Lee MD ELECTRONICALLY SIGNED 07/11/2013 10:14

## 2014-09-24 NOTE — H&P (Signed)
PATIENT NAME:  Sarah Orozco, HARTER MR#:  [id number] DATE OF BIRTH:  Dec 08, 1927  DATE OF ADMISSION:  09/05/2011  HISTORY OF PRESENT ILLNESS:  This 81 year old female came in with a chief complaint of a painful hernia. She reports she has had a large ventral hernia for a number of years, but has had much pain in the hernia today. She reports that when she was getting ready to come to the Emergency Room she vomited, has had some nausea in the Emergency Room, but has not thrown up since she reached the Emergency Room. She reports that she has history of alternating constipation and diarrhea, has been passing some flatus per rectum today. She reports no chills or fever. She was initially evaluated by the Emergency Room staff and found to have an incarcerated ventral hernia, also had CT scan which demonstrated segment of the transverse colon within the hernia. Also, there were multiple loops of small bowel in the pelvis and the abdomen with mild bowel wall thickening, nonspecific stranding in the mesentery.   PAST MEDICAL HISTORY:  1. Carcinoma of the left breast and has had a partial mastectomy. Has had radiation.  2. She reports a history of carcinoma of the right breast although on exam I cannot identify the scar in the right breast.  3. There is a history of lymphoma, has been under the care of Dr. Oliva Bustard and has had chemotherapy. Has had a Port-A-Cath on the right which has been removed.  4. She has a history of uterine cancer and total abdominal hysterectomy, bilateral salpingo-oophorectomy.  5. Non-insulin-dependent diabetes mellitus. 6. Hypertension. 7. She reports some valvular heart disease, but no history of heart failure. No history of myocardial infarction.  8. She has a distant history of sleep apnea.  9. Arthritis. 10. Hiatus hernia.  11. Upper endoscopy in the past. 12. Colonoscopy. She has had colon polyps removed. She says it has been a number of years since she has had a colonoscopy.    PAST SURGICAL HISTORY:  1. Left partial mastectomy. 2. Total abdominal hysterectomy and bilateral salpingo-oophorectomy. 3. Appendectomy.  4. She reports the above-mentioned colonoscopy.   FAMILY HISTORY: Positive for colon cancer, breast cancer, uterine cancer, diabetes, hypertension, strokes.  SOCIAL HISTORY: She lives locally and does go to [facility] during the daytime. Does not smoke. Does not drink any alcohol.   MEDICATIONS:  1. Glipizide 2.5 mg daily. 2. Lisinopril 10 mg daily.  3. Prilosec 20 mg daily. 4. Sertraline 50 mg daily.  5. Calcium 600 + D two times per day.  6. Imodium AD 2 mg p.r.n. 7. Multiple vitamin.  8. Ocuvite. 9. Oxybutynin 5 mg tablet 2 times per day.   DRUG ALLERGIES: None known. She does report an allergy to adhesive.   REVIEW OF SYSTEMS: She also reports having a mild headache tonight. Also has some acute and chronic neck pain. Just recently had two teeth pulled. She reports no current cough, cold, or sore throat. No dyspnea. She reports no recent exertional chest pains. She reports no recent sores or boils. She does sometimes get ankle edema, but not at present. She reports alternating constipation and diarrhea. No rectal bleeding. She has been incontinent of stool and also incontinent of urine. She is not having any dysuria. She reports just limited amount of walking. Review of systems otherwise negative.   PHYSICAL EXAMINATION:  VITAL SIGNS: Temperature 97.1, pulse 61, respirations 18, blood pressure 176/86, pulse oximetry 98%. Pain scale initially was an 8 and  was less painful by the time of surgical consultation. The patient reports she had received some analgesic, although I do not see that recorded on record.   GENERAL: She was seen on the Emergency Room stretcher.   VITAL SIGNS: Height 66 inches, weight 260 pounds, BMI of 42, pulse ox 98%.   SKIN: Warm and dry without rash.   HEENT: Pupils equal and reactive to light. Extraocular movements  are intact. Sclerae clear. Pharynx clear.   NECK: Supple with no palpable mass.   LUNGS: Lung sounds are clear. No respiratory distress.   HEART: Regular rhythm, S1 and S2 without murmur.   ABDOMEN: Obese. There is a localized palpable tender mass in the right lower abdomen. This is just to the right of the midline and below the level of the umbilicus. This mass was found to be approximately 12 cm in diameter. There was no other palpable mass in the abdomen. No other area of tenderness. This was manipulated and moderately tender and over a period of approximately three minutes of manipulation, could eventually be reduced, but then quickly started to herniate again.   RECTAL: Demonstrated no palpable mass. There is weak sphincter tone.   EXTREMITIES: No dependent edema.   NEUROLOGIC: Awake, alert, and oriented, moving all extremities.   CLINICAL DATA: Metabolic panel C with a glucose of 125, albumin slightly low at 3.3, otherwise normal. Troponin less than 0.2. White blood count 10,800, hemoglobin 13.7.   I reviewed her CT images which do demonstrate a ventral hernia, does contain transverse colon. There is a large gallstone. There did not appear to be any gallbladder wall thickening.   IMPRESSION: Transanally incarcerated ventral hernia with associated vomiting.   RECOMMENDATIONS: I recommend admission to the hospital, keep her at bed rest, give her IV fluids for hydration, and will plan to repair the hernia in the morning. I discussed the     operation, care, risks, and benefits with her in detail, will use a preop prophylactic antibiotic and may possibly use prosthetic mesh to reinforce the repair.   ____________________________ J. Rochel Brome, MD jws:ap D: 09/05/2011 21:08:58 ET T: 09/06/2011 08:03:39 ET JOB#: [id number]  cc: Loreli Dollar, MD, <Dictator> Loreli Dollar MD ELECTRONICALLY SIGNED 09/06/2011 16:56

## 2014-09-24 NOTE — Op Note (Signed)
PATIENT NAME:  Sarah Orozco, Sarah Orozco MR#:  [id number] DATE OF BIRTH:  1927/10/20  DATE OF PROCEDURE:  09/06/2011  PREOPERATIVE DIAGNOSIS: Incarcerated ventral hernia.   POSTOPERATIVE DIAGNOSIS: Incarcerated ventral hernia.    PROCEDURE: Ventral hernia repair.   SURGEON: Rochel Brome, M.D.   ANESTHESIA: General.   INDICATIONS: This 81 year old female has a past history of hysterectomy. She has a longstanding history of ventral hernia and has developed pain at this site with associated nausea and vomiting. This was partially reducible in the Emergency Room, however could not be reduced after induction of anesthesia.   DESCRIPTION OF PROCEDURE: The patient was placed on the operating table in the supine position under general endotracheal anesthesia. The abdomen was prepared with ChloraPrep and draped in a sterile manner. The mass was approximately 12 to 15 cm in dimension just to the right of the midline below the umbilicus. A longitudinally oriented incision was made some 14 cm in length in the midline at the site of an old scar and carried down through subcutaneous tissues and encountered a very thin-walled ventral hernia sac which was dissected free from surrounding structures. This was a somewhat tedious dissection. There was colon within the hernia. The fascial ring defect was delineated and was approximately 5 cm in diameter. There was also some fatty tissue within the sac itself. At this point it could not be reduced and it was necessary to enlarge the fascial defect laterally and medially in both directions by about 1.5 cm on each side and I could subsequently get the hernia reduced. There was no visible posterior rectus sheath although rectus muscle was identified. The peritoneum was closed with a running 2-0 chromic suture. Next, an Atrium mesh was selected and cut to create an oval shape of some 5 x 7 cm and this was transversely oriented and deep to the anterior rectus sheath. It was sutured to the  overlying fascia circumferentially with interrupted 0 Surgilon sutures. Next, the fascia was closed over the mesh incorporating mesh into each suture with a transversely oriented suture line of interrupted 0 Surgilon figure-of-eight sutures. It is noted that during the course of the procedure a number of small bleeding points were cauterized. Hemostasis was subsequently intact. Next, scarred subcutaneous tissue was approximated in the midline with interrupted 2-0 chromic. The skin was closed with interrupted 4-0 nylon vertical mattress sutures. Dressings were applied with paper tape. The patient tolerated surgery satisfactorily and was then moved to the recovery room for postoperative care.  ____________________________ J. Rochel Brome, MD jws:ap D: 09/06/2011 15:02:52 ET T: 09/07/2011 14:22:25 ET JOB#: [id number]  cc: Loreli Dollar, MD, <Dictator> Loreli Dollar MD ELECTRONICALLY SIGNED 09/09/2011 18:15

## 2014-09-24 NOTE — Discharge Summary (Signed)
PATIENT NAME:  Sarah Orozco, Sarah Orozco MR#:  [id number] DATE OF BIRTH:  [birth year]  DATE OF ADMISSION:  09/05/2011 DATE OF DISCHARGE:  09/09/2011  HISTORY OF PRESENT ILLNESS: This 81 year old female came in with chief complaint of a painful hernia. She had had a large ventral hernia for some 10 years but prior to admission developed pain at the site, also had nausea and vomiting, came into the Emergency Room where she was initially evaluated by the Emergency Room staff and had CT scan which I viewed which demonstrated transverse colon within large hernia in the right lower abdomen. This was the site of her pain.   PAST MEDICAL HISTORY:  1. Carcinoma of the left breast and left partial mastectomy and radiation many years ago. She reported a history of right breast cancer in the Emergency Room, however, records indicate she has not had cancer of the right breast by has been followed by Dr. Bary Castilla and Dr. Oliva Bustard and there is concern about a nodule in the left breast which according to the patient is cancer and she is having to make a decision about which direction to take.  2. She reports history of uterine cancer and total abdominal hysterectomy, bilateral salpingo-oophorectomy.  3. She has a history of lymphoma, has been treated by Dr. Oliva Bustard, continues to be followed by Dr. Oliva Bustard.  4. Non-insulin-dependent diabetes mellitus. 5. Hypertension.  6. History of valvular heart disease. No history of heart failure.  7. Distant history of sleep apnea.  8. Arthritis.  9. Hiatus hernia.  10. History of endoscopy. 11. History of colonoscopy with polyps removed.   MEDICATIONS:  1. Glipizide 2.5 mg daily.  2. Lisinopril 10 mg daily.  3. Prilosec 20 mg daily. 4. Sertraline 50 mg daily.  5. Calcium 600+ D b.i.d.  6. Imodium AD 2 mg p.r.n. for diarrhea. 7. Multiple vitamin daily.  8. Ocuvite daily.  9. Oxybutynin 5 mg 2 times per day.   DRUG ALLERGIES: None known.   PHYSICAL EXAMINATION: VITAL SIGNS:  Temperature 97.1, pulse 61, respirations 18, blood pressure 176/86, pulse oximetry 98%. LUNGS: Her lung sounds were clear. HEART: Regular rhythm, S1 and S2. ABDOMEN: Abdomen was obese. There was an approximately 12 cm mass which was manipulated and was moderately tender and over a period of approximately three minutes could be reduced approximately 80%, could tell that there was gas moving within the mass and after manipulation started to recur.  LABORATORY, DIAGNOSTIC AND RADIOLOGICAL DATA: Metabolic C panel with a glucose of 125, albumin slightly low at 3.3. Troponin less than 0.2. White blood count 10,800, hemoglobin 13.7.   HOSPITAL COURSE: She was admitted emergently. Started on IV fluids, initially kept n.p.o.   She was carried to the Operating Room the next day and had repair of a ventral hernia. This is actually found to be incarcerated on the operating table and then it could not be reduced prior to incision so she had the repair and also incorporated Atrium mesh into the repair.   Postoperatively she is kept in the hospital for a period of postoperative care. She had just some moderate amount of incisional pain. She soon began a liquid diet and gradually advanced to 1600 calorie ADA diet which she is tolerating satisfactorily. She has moved her bowels. She has been incontinent of stool and urine. She has had very limited amount of walking at the hospital with a walker, has not walked any today.   FINAL DIAGNOSIS: Incarcerated ventral hernia.   PLAN: Plans are  to go to rehab bed at [facility]. Will have her on her usual medicines as dictated above and she will follow up with me in the office in two weeks.      Also anticipate follow up with Dr. Bary Castilla regarding her breast and Dr. Oliva Bustard as far as history of breast cancer and lymphoma.   ____________________________ J. Rochel Brome, MD jws:cms D: 09/09/2011 13:12:58 ET T: 09/09/2011 13:29:32 ET JOB#: [id number]  cc: Loreli Dollar,  MD, <Dictator> Robert Bellow, MD Martie Lee. Oliva Bustard, MD Loreli Dollar MD ELECTRONICALLY SIGNED 09/09/2011 18:15

## 2014-09-24 NOTE — Op Note (Signed)
PATIENT NAME:  Sarah Orozco, Sarah Orozco MR#:  [id number] DATE OF BIRTH:  1927-09-20  DATE OF PROCEDURE:  10/21/2011  PREOPERATIVE DIAGNOSIS: Left breast mass.   POSTOPERATIVE DIAGNOSIS: Left breast mass.   PROCEDURE: Excision of left breast mass.   SURGEON: Rochel Brome, MD   ANESTHESIA: Local 1% Xylocaine with monitored anesthesia care.   INDICATIONS: This 81 year old female with a past history of left breast cancer and partial mastectomy and radiation had a recent mammogram and ultrasound depicting a nodule in the outer aspect of the left breast. She had had previous needle biopsy which was suspicious of cancer, and excision of the entire mass was recommended for further evaluation. The patient had preoperative ultrasound-guided insertion of the Kopans wire and subsequent mammogram images. These images were reviewed prior to surgery.   DESCRIPTION OF PROCEDURE: The patient was placed on the operating table in the supine position. An IV was started in the left leg. She was sedated. The dressing was removed from the left breast exposing the Kopans wire, which was cut 3 cm from the skin. Her arm was placed on lateral arm support. The left breast was prepared with ChloraPrep and draped in a sterile manner.  The skin of the lateral aspect of the left breast was infiltrated with 1% Xylocaine. An incision was made from approximately 2:00 to 4:00 position approximately 5 cm lateral to the areola just medial to the entrance point of the wire. A narrow ellipse approximately 8 mm wide was excised with this curvilinear incision. Dissection was carried down through subcutaneous tissues and encountered the wire. Next, a sample of tissue surrounding the wire was excised. This  sample of tissue was approximately 3 x 3 x 4 cm in dimension. There was no tumor at the site of dissection but I could feel some firmness within the specimen.  I dissected down to the hook of the wire, and the specimen was submitted for specimen  mammogram and routine pathology. The wound was inspected. One clamped vessel was ligated with 4-0 chromic. Another clamped vessel was suture ligated with 4-0 chromic. Several small bleeding points were cauterized. Next, the tissues were infiltrated with 0.5% Sensorcaine with epinephrine. The subcutaneous tissues were closed with interrupted 4-0 chromic, and the skin was closed with running 5-0 Monocryl subcuticular suture and Dermabond. The patient tolerated surgery satisfactorily and was then prepared for transfer to the recovery room.    ____________________________ Lenna Sciara. Rochel Brome, MD jws:cbb D: 10/21/2011 12:23:39 ET T: 10/21/2011 13:06:41 ET JOB#: [id number]  cc: Loreli Dollar, MD, <Dictator> Loreli Dollar MD ELECTRONICALLY SIGNED 10/27/2011 11:13

## 2014-09-24 NOTE — Consult Note (Signed)
Chief Complaint:   Subjective/Chief Complaint i am having lot of pain   VITAL SIGNS/ANCILLARY NOTES: **Vital Signs.:   15-Apr-13 07:30   Vital Signs Type POCT   Nurse Fingerstick (mg/dL) FSBS (fasting range 65-99 mg/dL) 127   Comments/Interventions  Nurse Notified    11:52   Vital Signs Type POCT   Nurse Fingerstick (mg/dL) FSBS (fasting range 65-99 mg/dL) 127   Comments/Interventions  Nurse Notified    13:24   Vital Signs Type Routine   Temperature Temperature (F) 98.1   Celsius 36.7   Temperature Source oral   Pulse Pulse 72   Pulse source per Dinamap   Systolic BP Systolic BP 397   Diastolic BP (mmHg) Diastolic BP (mmHg) 66   Mean BP 78   BP Source Dinamap   Pulse Ox % Pulse Ox % 91   Pulse Ox Activity Level  At rest   Oxygen Delivery 2L    14:00   Vital Signs Type Recheck   Pulse Ox % Pulse Ox % 94   Pulse Ox Activity Level  At rest   Oxygen Delivery 2L; Nasal Cannula    16:43   Vital Signs Type POCT   Nurse Fingerstick (mg/dL) FSBS (fasting range 65-99 mg/dL) 163   Comments/Interventions  Nurse Notified  *Intake and Output.:   15-Apr-13 04:13   Grand Totals Intake:   Output:      Net:   40 Hr.:     Urinary Method  Void; Incontinent   Unmeasured Output  Void    08:40   Grand Totals Intake:  700 Output:      Net:  700 24 Hr.:  700   Oral Intake      In:  700    10:06   Grand Totals Intake:   Output:      Net:   24 Hr.:  700   Urinary Method  Void   Unmeasured Output  Void    Shift 15:00   Grand Totals Intake:  700 Output:      Net:  700 24 Hr.:  700   Oral Intake      In:  700   Length of Stay Totals Intake:  700 Output:      Net:  700   Brief Assessment:   Cardiac Regular    Respiratory normal resp effort    Gastrointestinal Normal    Gastrointestinal details normal Soft  tenderness right side of abdomen   Routine Hem:  14-Apr-13 05:36    WBC (CBC) 14.5   RBC (CBC) 4.73   Hemoglobin (CBC) 13.1   Hematocrit (CBC) 40.0   Platelet  Count (CBC) 264   MCV 85   MCH 27.7   MCHC 32.7   RDW 14.2  Routine Chem:  14-Apr-13 05:36    Glucose, Serum 158   BUN 22   Creatinine (comp) 0.98   Sodium, Serum 139   Potassium, Serum 4.6   Chloride, Serum 101   CO2, Serum 28   Calcium (Total), Serum 9.1  Hepatic:  14-Apr-13 05:36    Bilirubin, Total 0.3   Alkaline Phosphatase 104   SGPT (ALT) 20   SGOT (AST) 18   Total Protein, Serum 7.2   Albumin, Serum 3.0  Routine Chem:  14-Apr-13 05:36    Osmolality (calc) 284   eGFR (African American) >60   eGFR (Non-African American) 54   Anion Gap 10   Lipase 209  Cardiac:  14-Apr-13 05:36    Troponin I <  0.02  Routine UA:  14-Apr-13 11:22    Color (UA) Yellow   Clarity (UA) Cloudy   Glucose (UA) Negative   Bilirubin (UA) Negative   Ketones (UA) Negative   Specific Gravity (UA) 1.021   Blood (UA) 1+   pH (UA) 5.0   Protein (UA) Negative   Nitrite (UA) Positive   Leukocyte Esterase (UA) 2+   RBC (UA) 4 /HPF   WBC (UA) 39 /HPF   Bacteria (UA) 3+   Epithelial Cells (UA) 2 /HPF   Mucous (UA) PRESENT   Uric Acid Crystal (UA) PRESENT  Blood Glucose:  14-Apr-13 21:13    POCT Blood Glucose 87  15-Apr-13 07:30    POCT Blood Glucose 127    11:51    POCT Blood Glucose 127    16:44    POCT Blood Glucose 163   Assessment/Plan:  Assessment/Plan:   Plan will advance diet home tomorrow   Electronic Signatures: Vella Kohler (MD)  (Signed 15-Apr-13 17:11)  Authored: Chief Complaint, VITAL SIGNS/ANCILLARY NOTES, Brief Assessment, Lab Results, Assessment/Plan   Last Updated: 15-Apr-13 17:11 by Vella Kohler (MD)

## 2014-09-24 NOTE — H&P (Signed)
Subjective/Chief Complaint pt complains of abdominal pain and nausea    History of Present Illness pain is in the lower abdomen with nausea and vomitingand also had mild diarrohea.pt had recent ventral hernia surgery    Past History diabetes,hypertension incarcerated ventral hernia surgery    Past Medical Health Hypertension, Diabetes Mellitus   Past Med/Surgical Hx:  Sleep Apnea: Cannot tolerate C-PAP  Hx Non Hodgkin's Lymphoma:   Diabetes Mellitus, Type II (NIDD):   neck spurs:   lymphoma:   Cervical Cancer:   Breast Cancer:   HTN:   Hx of Uterine Cancer:   Lumpectomy Right Breast:   Removal of Porta Cath:   Insertion of Porta Cath:   Total Abdominal Hysterectomy:   Carpal Tunnel Release: right  Knee Surgery - Right:   ALLERGIES:  NKDA: Other  Adhesive: Other  HOME MEDICATIONS: Medication Instructions Status  Prilosec 20 mg oral delayed release capsule 1 cap(s) PO once a day AM Active  lisinopril 10 mg oral tablet 1 tab(s) PO once a day AM Active  glipiZIDE 2.5 mg oral tablet, extended release 1 tab(s) PO once a day AM Active  oxybutynin 5 mg oral tablet 1  orally 2 times a day  Active  multivitamin  orally once a day Active  occuvite  1 tab(s) orally once a day Active  Calcium 600+D 1 tab(s) orally 2 times a day Active  sertraline 100 mg oral tablet 1 tab(s) orally 2 times a day Active  Imodium A-D 2 mg oral tablet 1 tab(s) orally every 4 hours, As Needed do not exceed 6 tabs Active  Tylenol 325 mg oral tablet tab(s) orally  1-2 tabs every 4 hrs. prn Active     Medications as above   Family and Social History:   Family History Non-Contributory    Social History positive  tobacco    Place of Living Nursing Home   Review of Systems:   Fever/Chills No    Cough No    Sputum No    Abdominal Pain Yes    Diarrhea Yes    Constipation No    Nausea/Vomiting Yes    SOB/DOE No    Chest Pain No    Dysuria No    Tolerating PT No    Tolerating Diet  Yes    ROS Pt not able to provide ROS    Medications/Allergies Reviewed Medications/Allergies reviewed   Physical Exam:   GEN obese, alert and oriented    HEENT dry oral mucosa, good dentition, normal    NECK supple  trachea midline    RESP normal resp effort  clear BS  no use of accessory muscles    CARD regular rate  no murmur  No LE edema  no JVD    ABD denies tenderness  denies Flank Tenderness  no liver/spleen enlargement  no hernia  soft  normal BS  wound good    GU none    SKIN normal to palpation    NEURO cranial nerves intact, strength:, motor/sensory function intact    PSYCH alert, good insight    Additional Comments pt is lying comfortably   Routine Hem:  14-Apr-13 05:36    WBC (CBC) 14.5   RBC (CBC) 4.73   Hemoglobin (CBC) 13.1   Hematocrit (CBC) 40.0   Platelet Count (CBC) 264   MCV 85   MCH 27.7   MCHC 32.7   RDW 14.2  Routine Chem:  14-Apr-13 05:36    Glucose, Serum 158  BUN 22   Creatinine (comp) 0.98   Sodium, Serum 139   Potassium, Serum 4.6   Chloride, Serum 101   CO2, Serum 28   Calcium (Total), Serum 9.1  Hepatic:  14-Apr-13 05:36    Bilirubin, Total 0.3   Alkaline Phosphatase 104   SGPT (ALT) 20   SGOT (AST) 18   Total Protein, Serum 7.2   Albumin, Serum 3.0  Routine Chem:  14-Apr-13 05:36    Osmolality (calc) 284   eGFR (African American) >60   eGFR (Non-African American) 54   Anion Gap 10   Lipase 209  Cardiac:  14-Apr-13 05:36    Troponin I < 0.02  Routine UA:  14-Apr-13 11:22    Color (UA) Yellow   Clarity (UA) Cloudy   Glucose (UA) Negative   Bilirubin (UA) Negative   Ketones (UA) Negative   Specific Gravity (UA) 1.021   Blood (UA) 1+   pH (UA) 5.0   Nitrite (UA) Positive   Leukocyte Esterase (UA) 2+   RBC (UA) 4 /HPF   WBC (UA) 39 /HPF   Bacteria (UA) 3+   Epithelial Cells (UA) 2 /HPF   Mucous (UA) PRESENT   Uric Acid Crystal (UA) PRESENT   Radiology Results: CT:    14-Apr-13 11:58, CT Abdomen and  Pelvis With Contrast   CT Abdomen and Pelvis With Contrast   REASON FOR EXAM:    (1) lower abd pain, post surg; (2) abd pain, post surg  COMMENTS:       PROCEDURE: CT  - CT ABDOMEN / PELVIS  W  - Sep 14 2011 11:58AM     RESULT: CT abdomen and pelvis dated 09/14/2011 comparison made to prior   study dated 09/05/2011.    Technique: A helical 3 mm sections were obtained from lung base through   the pubic symphysis status post intravenous administration of 100 mL of   Isovue-300.    Findings: Hypoventilation identified within the lung bases. Linear be   oriented areas of increased is present in the posterior medial basal   aspect of the right left lower lobes. There is like represent atelectasis     or scarring. Left evaluation recommended if and as clinically warranted.    The liver, adrenals, pancreas, spleen are unremarkable. An extrarenal   pelvis is appreciated while the left kidney as well as likely peripelvic   cyst. Similar findings identified on the right though to a lesser extent.   Renal findings are stable when compared to previous study. A component of   mild hydronephrosis on the left cannot be excluded. Extrarenal pelvis   appears distended and a partial UPJ obstruction is a diagnostic   consideration. There is no evidence of renal calculus disease.    A calcified gallstone measuring 1.8 x 2.24 cm is identified within a   distended gallbladder measuring 5.53 x 5. 14 cm. A small amount of   pericholecystic fluid is identified. This may reflect postsurgical   change. The fluid is focal and projects along the anterior aspect of the   gallbladder extending into a perihepatic location. There is no evidence     of intra nor extrahepatic of a ductal dilatation.    A small amount of peritoneal fluid is identified as well as mild   stranding. These findings may represent postsurgical change.    Findings consistent with the patient's recent ventral abdominal wall   hernia repair  appreciated. There is mild stranding in the subcutaneous  fat as well as foci of air. A linearly oriented subcutaneous fluid   collections identified at the wound site measuring 6.88 x 1.94 cm in   orthogonal dimensions. Again this may represent postsurgical change. Deep   to the peritoneum in the region of prior surgery there is amorphous area   of soft tissue attenuation, which appears to involve the rectus   abdominous musculature. No associated loculated fluid collection are   identified in this finding is likely secondary to recent hernia repair.    Evaluation pelvis demonstrates free fluid within the cul-de-sac.    There is no evidence of abdominal or pelvic drainable loculated fluid   collections. There is no evidence of bowel obstruction no secondary signs   reflecting enteritis, colitis, nor diverticulitis.    IMPRESSION:  Changes within the abdominal wall in the area of recent   hernia repair. These findings may reflect postsurgical change. A   subcutaneous fluid collection is identified primarily has a linear   orientation as described above.  2. Stable gallstone within the gallbladder. The previous described   pericholecystic fluidlikely represents postsurgical change considering   the focality of this finding.  3. Findings which may represent the sequela of a partial UPJ obstruction   on the left.  4. Otherwise no evidence of bowel obstruction nor further evidence of   inflammatory changes.          Verified By: Mikki Santee, M.D., MD     Assessment/Admission Diagnosis i do not think there is complication because of surgery it could be gastroentiritis will watch her over noght might go home tomorrow if she tolerates diet    Plan Intravenous fluids and zofran for nausea clear liquids and antibiotics for possible infection   Electronic Signatures: Vella Kohler (MD)  (Signed 14-Apr-13 20:47)  Authored: CHIEF COMPLAINT and HISTORY, PAST MEDICAL/SURGIAL HISTORY,  ALLERGIES, HOME MEDICATIONS, OTHER MEDICATIONS, FAMILY AND SOCIAL HISTORY, REVIEW OF SYSTEMS, PHYSICAL EXAM, LABS, Radiology, ASSESSMENT AND PLAN   Last Updated: 14-Apr-13 20:47 by Vella Kohler (MD)

## 2014-10-02 ENCOUNTER — Ambulatory Visit (INDEPENDENT_AMBULATORY_CARE_PROVIDER_SITE_OTHER): Payer: Medicare Other | Admitting: Cardiovascular Disease

## 2014-10-02 ENCOUNTER — Telehealth: Payer: Self-pay | Admitting: Cardiovascular Disease

## 2014-10-02 ENCOUNTER — Encounter: Payer: Self-pay | Admitting: Cardiovascular Disease

## 2014-10-02 VITALS — BP 150/64 | HR 76 | Ht 64.5 in | Wt 228.3 lb

## 2014-10-02 DIAGNOSIS — I5032 Chronic diastolic (congestive) heart failure: Secondary | ICD-10-CM

## 2014-10-02 DIAGNOSIS — E119 Type 2 diabetes mellitus without complications: Secondary | ICD-10-CM

## 2014-10-02 DIAGNOSIS — G4733 Obstructive sleep apnea (adult) (pediatric): Secondary | ICD-10-CM

## 2014-10-02 DIAGNOSIS — R0602 Shortness of breath: Secondary | ICD-10-CM

## 2014-10-02 DIAGNOSIS — E1165 Type 2 diabetes mellitus with hyperglycemia: Secondary | ICD-10-CM

## 2014-10-02 DIAGNOSIS — I10 Essential (primary) hypertension: Secondary | ICD-10-CM | POA: Diagnosis not present

## 2014-10-02 NOTE — Assessment & Plan Note (Signed)
Will defer management of diabetes to her primary care

## 2014-10-02 NOTE — Assessment & Plan Note (Signed)
Blood pressure continues to run high. Recommended she start clonidine 0.1 mg twice a day

## 2014-10-02 NOTE — Assessment & Plan Note (Signed)
We have recommended she stay on her HCTZ. Improvement of her leg edema since her prior clinic visit

## 2014-10-02 NOTE — Assessment & Plan Note (Signed)
Patient denies significant shortness of breath on today's visit. We'll continue HCTZ, work on her blood pressure

## 2014-10-02 NOTE — Assessment & Plan Note (Signed)
Diagnosis of sleep apnea. Unclear if she is on CPAP

## 2014-10-02 NOTE — Telephone Encounter (Signed)
Patient started on clonidine today.  This is 3rd bp medication.  POA Jana Half wants to clarify that Dr. Rockey Situ wants to continue all 3 or does she need to stop taking one or both of the other two.  Please call David Stall.

## 2014-10-02 NOTE — Assessment & Plan Note (Signed)
We have encouraged careful diet management in an effort to lose weight. Unable to exercise as she is not ambulating. Could consider pool therapy

## 2014-10-02 NOTE — Patient Instructions (Signed)
You are doing well.  Please start clonidine 0.1 mg pill twice a day Please check the blood pressure twice a day Please fax to the office in 2 weeks time :   Please call us if you have new issues that need to be addressed before your next appt.  Your physician wants you to follow-up in: 6 months.  You will receive a reminder letter in the mail two months in advance. If you don't receive a letter, please call our office to schedule the follow-up appointment.

## 2014-10-02 NOTE — Progress Notes (Signed)
Patient ID: Sarah Orozco, female    DOB: 04-Oct-1927, 81 y.o.   MRN: [id number]  HPI Comments: Sarah Orozco is a pleasant 81 year old woman with history of normal ejection fraction, chronic lower extremity edema, hypertension, incontinence and wears a diaper, diabetes, hyperlipidemia, breast cancer and uterine cancer, obstructive sleep apnea, non-Hodgkin's lymphoma with bulky disease of the right subclavian region who presents for routine visit of her hypertension and leg edema. She presents today in a wheelchair Prior notes documenting hemoglobin A1c 7.8 in mid 2015. Currently not on medications for diabetes  In follow-up today, she reports that her leg edema has improved. On her last clinic visit, she was started on HCTZ 25 mg daily. Daughter presents with her today and reports that she was having significant anxiety working with physical therapy. Sounds as if the therapy was held for now. Blood pressure check twice per day shows continued high   blood pressure with systolic in the 222L to 798X Daughter reports that she spends most of her time in her wheelchair, does not like to sit in her recliner  She reports that she is unable to walk very far. She can walk short distances with a walker but needs assistance.   Other past medical history  Notes from cardiology in July 2014 shows she was on Lasix 20 mg daily. She wears compression hose daily, significant ankle tenderness  Prior echocardiogram June 2014 showing normal LV systolic function, significant mitral, tricuspid regurgitation, biatrial enlargement, study read by outside cardiologist   No Known Allergies  Current Outpatient Prescriptions on File Prior to Visit  Medication Sig Dispense Refill  . losartan (COZAAR) 50 MG tablet Take 100 mg by mouth daily.     Marland Kitchen acetaminophen (TYLENOL) 325 MG tablet Take 325 mg by mouth every 6 (six) hours as needed.     . ALPRAZolam (XANAX) 0.25 MG tablet Take 0.25 mg by mouth 2 (two) times daily as  needed for anxiety.    Marland Kitchen amoxicillin (AMOXIL) 500 MG capsule Take 4 tablets 1 hour prior to dental procedures.    . Calcium Carbonate-Vitamin D (CALCIUM 600+D) 600-400 MG-UNIT per tablet Take 1 tablet by mouth 2 (two) times daily.    . Elastic Bandages & Supports (T.E.D. BELOW KNEE/S-REGULAR) MISC by Does not apply route.    . hydrochlorothiazide (HYDRODIURIL) 25 MG tablet Take 1 tablet (25 mg total) by mouth daily. 30 tablet 11  . Lutein 6 MG CAPS Take by mouth.    . meloxicam (MOBIC) 7.5 MG tablet Take 7.5 mg by mouth daily.    . miconazole (MICOTIN) 2 % cream Apply 1 application topically 2 (two) times daily.    . Multiple Vitamins-Minerals (THERAGRAN-M PREMIER 50 PLUS PO) Take by mouth daily.    Marland Kitchen Petrolatum-Zinc Oxide 49-15 % OINT Apply topically 2 (two) times daily.    . sertraline (ZOLOFT) 100 MG tablet Take 100 mg by mouth daily.     No current facility-administered medications on file prior to visit.    Past Medical History  Diagnosis Date  . Mitral valve insufficiency   . Diabetes mellitus without complication   . Sleep apnea   . Hyperlipidemia   . Hypertension   . History of breast cancer   . Anemia   . Urinary incontinence   . Osteoarthritis   . GERD (gastroesophageal reflux disease)   . History of uterine cancer   . Non-Hodgkin's lymphoma   . Incontinence     Past Surgical History  Procedure Laterality  Date  . Total abdominal hysterectomy w/ bilateral salpingoophorectomy    . Breast lumpectomy      left   . Tonsillectomy    . Appendectomy    . Knee replacement  03/17/2000    right knee   . Carpal tunnel release  03/17/2000  . Endoscopic insertion peritoneal catheter port  2003  . Cardiac catheterization      Social History  reports that she has never smoked. She does not have any smokeless tobacco history on file. She reports that she does not drink alcohol or use illicit drugs.  Family History Family history is unknown by patient.   Review of Systems   Constitutional: Negative.   Respiratory: Negative.   Cardiovascular: Positive for leg swelling.  Gastrointestinal: Negative.   Musculoskeletal: Positive for arthralgias and gait problem.  Neurological: Negative.   Hematological: Negative.   Psychiatric/Behavioral: Negative.   All other systems reviewed and are negative.   BP 150/64 mmHg  Pulse 76  Ht 5' 4.5" (1.638 m)  Wt 228 lb 5 oz (103.562 kg)  BMI 38.60 kg/m2  Physical Exam  Constitutional: She is oriented to person, place, and time. She appears well-developed and well-nourished.  Obese, presenting in a wheelchair  HENT:  Head: Normocephalic.  Nose: Nose normal.  Mouth/Throat: Oropharynx is clear and moist.  Eyes: Conjunctivae are normal. Pupils are equal, round, and reactive to light.  Neck: Normal range of motion. Neck supple. No JVD present.  Cardiovascular: Normal rate, regular rhythm, S1 normal, S2 normal, normal heart sounds and intact distal pulses.  Exam reveals no gallop and no friction rub.   No murmur heard. Pulmonary/Chest: Effort normal and breath sounds normal. No respiratory distress. She has no wheezes. She has no rales. She exhibits no tenderness.  Abdominal: Soft. Bowel sounds are normal. She exhibits no distension. There is no tenderness.  Musculoskeletal: Normal range of motion. She exhibits no edema or tenderness.  Lymphadenopathy:    She has no cervical adenopathy.  Neurological: She is alert and oriented to person, place, and time. Coordination normal.  Skin: Skin is warm and dry. No rash noted. No erythema.  Psychiatric: She has a normal mood and affect. Her behavior is normal. Judgment and thought content normal.    Assessment and Plan  Nursing note and vitals reviewed.

## 2014-10-03 ENCOUNTER — Telehealth: Payer: Self-pay

## 2014-10-03 NOTE — Telephone Encounter (Signed)
Spoke w/ POA.  Advised her to contact PCPs office for referral, as they will have more info to send regarding pt's diagnosis.  She is agreeable and will call back w/ any concerns.

## 2014-10-03 NOTE — Telephone Encounter (Signed)
Pt POA would like a referall to Dr. Lavone Orn, endocrinologist, at [facility], for her diabetes. Please call.

## 2014-10-24 NOTE — Telephone Encounter (Signed)
Received fax from [facility] w/ pt's recent BP readings:   9am  8pm 5/3 135/68, 82 128/80, 79 5/4   157/72, 60 5/5 135/53, 65 150/86, 59 5/6 143/52, 62 5/7 152/65, 60 164/91, 61 5/8 109/56, 56 158/88, 73 5/9 137/57, 61 139/90, 98 5/10 127/57, 74 146/82, 64 5/11 171/72, 55  5/12 152/68, 63 5/13 192/79, 62 5/15 184/69, 58 159/81, 66 5/16 191/81, 58 156/81, 56  Per Dr. Rockey Situ, need another week of readings for review.

## 2014-11-09 ENCOUNTER — Telehealth: Payer: Self-pay | Admitting: Cardiovascular Disease

## 2014-11-09 NOTE — Telephone Encounter (Signed)
Patient POA, Jana Half would like patient to be able to have physical therapy at [facility].  Facility faxed over orders.  FYI patient is between PCP and old refuses to do order and new has not seen patient yet.

## 2014-11-16 ENCOUNTER — Other Ambulatory Visit: Payer: Self-pay

## 2014-11-16 MED ORDER — LOSARTAN POTASSIUM 50 MG PO TABS: 100.0000 mg | ORAL_TABLET | Freq: Every day | ORAL | Status: DC

## 2014-11-16 NOTE — Telephone Encounter (Signed)
Refill sent for losartan potassium. 

## 2014-11-30 ENCOUNTER — Other Ambulatory Visit: Payer: Self-pay | Admitting: *Deleted

## 2014-11-30 ENCOUNTER — Telehealth: Payer: Self-pay | Admitting: Cardiovascular Disease

## 2014-11-30 MED ORDER — CLONIDINE HCL 0.1 MG PO TABS: 0.1000 mg | ORAL_TABLET | Freq: Two times a day (BID) | ORAL | Status: DC

## 2014-11-30 NOTE — Telephone Encounter (Signed)
Left message on Erin's vm for pt to proceed w/ PT, but asked her to make sure that pt has an appt w/ PCP so that they can follow pt and give future orders. Asked her to call back w/ any questions or concerns.

## 2014-11-30 NOTE — Telephone Encounter (Signed)
Patient had pt eval.    Please call Erin at [facility]  with verbal order for at home physical therapy as follows:  1 x week for 1 week 3x week for 4 weeks 2x week for 2 weeks  Call Erin at [phone] and leave a secure vm if no answer

## 2014-12-05 ENCOUNTER — Other Ambulatory Visit: Payer: Self-pay

## 2014-12-05 MED ORDER — CLONIDINE HCL 0.1 MG PO TABS: 0.1000 mg | ORAL_TABLET | Freq: Two times a day (BID) | ORAL | Status: DC

## 2014-12-05 NOTE — Telephone Encounter (Signed)
Refill clonidine hcl 0.1 mg take one tablet twice a day.

## 2015-01-19 ENCOUNTER — Telehealth: Payer: Self-pay | Admitting: Cardiovascular Disease

## 2015-01-19 NOTE — Telephone Encounter (Signed)
Forward to [employer] [facility] [employer]

## 2015-01-19 NOTE — Telephone Encounter (Signed)
Need Verbal order to continue therapy 2x week for 8 weeks .  Please call Cira Rue,  Claiborne Billings at [phone].

## 2015-01-21 NOTE — Telephone Encounter (Signed)
Ok to continue thx

## 2015-01-22 NOTE — Telephone Encounter (Signed)
Left message for [facility] w/ Dr. Donivan Scull recommendation.  Asked her to call back w/ any questions or concerns.

## 2015-03-21 ENCOUNTER — Other Ambulatory Visit: Payer: Self-pay | Admitting: *Deleted

## 2015-03-21 MED ORDER — CLONIDINE HCL 0.1 MG PO TABS: 0.1000 mg | ORAL_TABLET | Freq: Two times a day (BID) | ORAL | Status: DC

## 2015-03-23 ENCOUNTER — Telehealth: Payer: Self-pay | Admitting: Cardiovascular Disease

## 2015-03-23 NOTE — Telephone Encounter (Signed)
Need verbal orders to continue physical therapy once weekly  8 weeks.    Please call back with orders

## 2015-03-23 NOTE — Telephone Encounter (Signed)
Forward to [place] 

## 2015-03-24 NOTE — Telephone Encounter (Signed)
I believe she was working with PT when we met her in 07/2014, That would mean order placed by another physician. Would ask primary care to extend treatment?

## 2015-03-26 NOTE — Telephone Encounter (Signed)
Spoke w/ Claiborne Billings. Advised her of Dr. Donivan Scull recommendation.  She reports that we have given pt's orders since June.  She asks that I give her one more verbal order, as pt mentioned that she will be going to Drs making house calls.

## 2015-04-09 ENCOUNTER — Inpatient Hospital Stay (HOSPITAL_COMMUNITY)
Admit: 2015-04-09 | Discharge: 2015-04-09 | Disposition: A | Discharge status: Home / Self Care | Payer: Medicare Other | Attending: Internal Medicine | Admitting: Internal Medicine

## 2015-04-09 ENCOUNTER — Emergency Department: Payer: Medicare Other

## 2015-04-09 ENCOUNTER — Inpatient Hospital Stay
Admission: EM | Admit: 2015-04-09 | Discharge: 2015-04-16 | DRG: 871 | Disposition: A | Discharge status: Skilled Nursing Facility | Payer: Medicare Other | Attending: Internal Medicine | Admitting: Internal Medicine

## 2015-04-09 DIAGNOSIS — Z91048 Other nonmedicinal substance allergy status: Secondary | ICD-10-CM | POA: Diagnosis not present

## 2015-04-09 DIAGNOSIS — F329 Major depressive disorder, single episode, unspecified: Secondary | ICD-10-CM | POA: Diagnosis present

## 2015-04-09 DIAGNOSIS — Z791 Long term (current) use of non-steroidal anti-inflammatories (NSAID): Secondary | ICD-10-CM

## 2015-04-09 DIAGNOSIS — K219 Gastro-esophageal reflux disease without esophagitis: Secondary | ICD-10-CM | POA: Diagnosis present

## 2015-04-09 DIAGNOSIS — Z885 Allergy status to narcotic agent status: Secondary | ICD-10-CM | POA: Diagnosis not present

## 2015-04-09 DIAGNOSIS — J9622 Acute and chronic respiratory failure with hypercapnia: Secondary | ICD-10-CM | POA: Diagnosis not present

## 2015-04-09 DIAGNOSIS — Z79899 Other long term (current) drug therapy: Secondary | ICD-10-CM | POA: Diagnosis not present

## 2015-04-09 DIAGNOSIS — E119 Type 2 diabetes mellitus without complications: Secondary | ICD-10-CM | POA: Diagnosis not present

## 2015-04-09 DIAGNOSIS — Z8249 Family history of ischemic heart disease and other diseases of the circulatory system: Secondary | ICD-10-CM

## 2015-04-09 DIAGNOSIS — Z8572 Personal history of non-Hodgkin lymphomas: Secondary | ICD-10-CM

## 2015-04-09 DIAGNOSIS — Z6838 Body mass index (BMI) 38.0-38.9, adult: Secondary | ICD-10-CM

## 2015-04-09 DIAGNOSIS — J9602 Acute respiratory failure with hypercapnia: Secondary | ICD-10-CM

## 2015-04-09 DIAGNOSIS — G4733 Obstructive sleep apnea (adult) (pediatric): Secondary | ICD-10-CM | POA: Diagnosis present

## 2015-04-09 DIAGNOSIS — Z96651 Presence of right artificial knee joint: Secondary | ICD-10-CM | POA: Diagnosis present

## 2015-04-09 DIAGNOSIS — J690 Pneumonitis due to inhalation of food and vomit: Secondary | ICD-10-CM | POA: Diagnosis not present

## 2015-04-09 DIAGNOSIS — A419 Sepsis, unspecified organism: Secondary | ICD-10-CM | POA: Diagnosis present

## 2015-04-09 DIAGNOSIS — M1990 Unspecified osteoarthritis, unspecified site: Secondary | ICD-10-CM | POA: Diagnosis not present

## 2015-04-09 DIAGNOSIS — Z853 Personal history of malignant neoplasm of breast: Secondary | ICD-10-CM

## 2015-04-09 DIAGNOSIS — E785 Hyperlipidemia, unspecified: Secondary | ICD-10-CM | POA: Diagnosis not present

## 2015-04-09 DIAGNOSIS — R0989 Other specified symptoms and signs involving the circulatory and respiratory systems: Secondary | ICD-10-CM

## 2015-04-09 DIAGNOSIS — J9621 Acute and chronic respiratory failure with hypoxia: Secondary | ICD-10-CM | POA: Diagnosis not present

## 2015-04-09 DIAGNOSIS — F419 Anxiety disorder, unspecified: Secondary | ICD-10-CM | POA: Diagnosis present

## 2015-04-09 DIAGNOSIS — Z23 Encounter for immunization: Secondary | ICD-10-CM | POA: Diagnosis not present

## 2015-04-09 DIAGNOSIS — J9801 Acute bronchospasm: Secondary | ICD-10-CM | POA: Diagnosis not present

## 2015-04-09 DIAGNOSIS — R0602 Shortness of breath: Secondary | ICD-10-CM

## 2015-04-09 DIAGNOSIS — N390 Urinary tract infection, site not specified: Secondary | ICD-10-CM

## 2015-04-09 DIAGNOSIS — I340 Nonrheumatic mitral (valve) insufficiency: Secondary | ICD-10-CM

## 2015-04-09 DIAGNOSIS — Z8542 Personal history of malignant neoplasm of other parts of uterus: Secondary | ICD-10-CM | POA: Diagnosis not present

## 2015-04-09 DIAGNOSIS — D649 Anemia, unspecified: Secondary | ICD-10-CM | POA: Diagnosis present

## 2015-04-09 DIAGNOSIS — Z7984 Long term (current) use of oral hypoglycemic drugs: Secondary | ICD-10-CM | POA: Diagnosis not present

## 2015-04-09 DIAGNOSIS — J9601 Acute respiratory failure with hypoxia: Secondary | ICD-10-CM

## 2015-04-09 LAB — URINALYSIS COMPLETE WITH MICROSCOPIC (ARMC ONLY)
Bilirubin Urine: NEGATIVE
Glucose, UA: NEGATIVE mg/dL
HGB URINE DIPSTICK: NEGATIVE
Ketones, ur: NEGATIVE mg/dL
Nitrite: POSITIVE — AB
PH: 6 (ref 5.0–8.0)
Protein, ur: NEGATIVE mg/dL
Specific Gravity, Urine: 1.017 (ref 1.005–1.030)

## 2015-04-09 LAB — COMPREHENSIVE METABOLIC PANEL
ALBUMIN: 3.1 g/dL — AB (ref 3.5–5.0)
ALK PHOS: 55 U/L (ref 38–126)
ALT: 17 U/L (ref 14–54)
ANION GAP: 3 — AB (ref 5–15)
AST: 20 U/L (ref 15–41)
BILIRUBIN TOTAL: 1 mg/dL (ref 0.3–1.2)
BUN: 24 mg/dL — ABNORMAL HIGH (ref 6–20)
CALCIUM: 8.9 mg/dL (ref 8.9–10.3)
CO2: 32 mmol/L (ref 22–32)
Chloride: 103 mmol/L (ref 101–111)
Creatinine, Ser: 1.04 mg/dL — ABNORMAL HIGH (ref 0.44–1.00)
GFR calc Af Amer: 55 mL/min — ABNORMAL LOW (ref >60)
GFR calc non Af Amer: 48 mL/min — ABNORMAL LOW (ref >60)
GLUCOSE: 155 mg/dL — AB (ref 65–99)
Potassium: 4.2 mmol/L (ref 3.5–5.1)
SODIUM: 138 mmol/L (ref 135–145)
TOTAL PROTEIN: 6.1 g/dL — AB (ref 6.5–8.1)

## 2015-04-09 LAB — BRAIN NATRIURETIC PEPTIDE: B NATRIURETIC PEPTIDE 5: 383 pg/mL — AB (ref 0.0–100.0)

## 2015-04-09 LAB — GLUCOSE, CAPILLARY
GLUCOSE-CAPILLARY: 120 mg/dL — AB (ref 65–99)
GLUCOSE-CAPILLARY: 239 mg/dL — AB (ref 65–99)
GLUCOSE-CAPILLARY: 176 mg/dL — AB (ref 65–99)
Glucose-Capillary: 117 mg/dL — ABNORMAL HIGH (ref 65–99)

## 2015-04-09 LAB — LIPASE, BLOOD: Lipase: 20 U/L (ref 11–51)

## 2015-04-09 LAB — BLOOD GAS, ARTERIAL
ACID-BASE EXCESS: 9.2 mmol/L — AB (ref 0.0–3.0)
BICARBONATE: 35 meq/L — AB (ref 21.0–28.0)
FIO2: 1
O2 SAT: 97.3 %
PCO2 ART: 56 mmHg — AB (ref 32.0–48.0)
PO2 ART: 98 mmHg (ref 83.0–108.0)
Patient temperature: 38
pH, Arterial: 7.41 (ref 7.350–7.450)

## 2015-04-09 LAB — CBC WITH DIFFERENTIAL/PLATELET
Basophils Absolute: 0.1 10*3/uL (ref 0–0.1)
Basophils Relative: 0 %
EOS ABS: 0.1 10*3/uL (ref 0–0.7)
EOS PCT: 0 %
HCT: 30.7 % — ABNORMAL LOW (ref 35.0–47.0)
Hemoglobin: 9.9 g/dL — ABNORMAL LOW (ref 12.0–16.0)
LYMPHS ABS: 1.3 10*3/uL (ref 1.0–3.6)
LYMPHS PCT: 9 %
MCH: 25.8 pg — AB (ref 26.0–34.0)
MCHC: 32.1 g/dL (ref 32.0–36.0)
MCV: 80.5 fL (ref 80.0–100.0)
MONO ABS: 1.1 10*3/uL — AB (ref 0.2–0.9)
MONOS PCT: 8 %
Neutro Abs: 12.2 10*3/uL — ABNORMAL HIGH (ref 1.4–6.5)
Neutrophils Relative %: 83 %
PLATELETS: 174 10*3/uL (ref 150–440)
RBC: 3.82 MIL/uL (ref 3.80–5.20)
RDW: 15.4 % — AB (ref 11.5–14.5)
WBC: 14.8 10*3/uL — ABNORMAL HIGH (ref 3.6–11.0)

## 2015-04-09 LAB — MRSA PCR SCREENING: MRSA BY PCR: NEGATIVE

## 2015-04-09 LAB — TROPONIN I: Troponin I: <0.03 ng/mL (ref <0.031)

## 2015-04-09 LAB — LACTIC ACID, PLASMA
Lactic Acid, Venous: 1 mmol/L (ref 0.5–2.0)
Lactic Acid, Venous: 1 mmol/L (ref 0.5–2.0)

## 2015-04-09 LAB — APTT: aPTT: 35 seconds (ref 24–36)

## 2015-04-09 LAB — PROTIME-INR
INR: 1.16
Prothrombin Time: 15 seconds (ref 11.4–15.0)

## 2015-04-09 MED ORDER — HEPARIN SODIUM (PORCINE) 5000 UNIT/ML IJ SOLN
5000.0000 [IU] | Freq: Three times a day (TID) | INTRAMUSCULAR | Status: DC
  Administered 2015-04-09 – 2015-04-16 (×21): 5000 [IU] via SUBCUTANEOUS
  Filled 2015-04-09 (×23): qty 1

## 2015-04-09 MED ORDER — ONDANSETRON HCL 4 MG PO TABS: 4.0000 mg | ORAL_TABLET | Freq: Four times a day (QID) | ORAL | Status: DC | PRN

## 2015-04-09 MED ORDER — IPRATROPIUM-ALBUTEROL 0.5-2.5 (3) MG/3ML IN SOLN
3.0000 mL | RESPIRATORY_TRACT | Status: DC | PRN
  Filled 2015-04-09 (×2): qty 3

## 2015-04-09 MED ORDER — INSULIN ASPART 100 UNIT/ML ~~LOC~~ SOLN
0.0000 [IU] | Freq: Every day | SUBCUTANEOUS | Status: DC
  Administered 2015-04-11: 22:00:00 2 [IU] via SUBCUTANEOUS
  Administered 2015-04-12: 4 [IU] via SUBCUTANEOUS
  Administered 2015-04-13: 23:00:00 3 [IU] via SUBCUTANEOUS
  Administered 2015-04-14: 4 [IU] via SUBCUTANEOUS
  Administered 2015-04-15: 22:00:00 3 [IU] via SUBCUTANEOUS
  Filled 2015-04-09: qty 3
  Filled 2015-04-09: qty 4
  Filled 2015-04-09: qty 2
  Filled 2015-04-09: qty 4

## 2015-04-09 MED ORDER — SODIUM CHLORIDE 0.9 % IV SOLN
INTRAVENOUS | Status: DC
Start: 2015-04-09 — End: 2015-04-09

## 2015-04-09 MED ORDER — MICONAZOLE NITRATE 2 % EX CREA
1.0000 "application " | TOPICAL_CREAM | Freq: Two times a day (BID) | CUTANEOUS | Status: DC
  Administered 2015-04-09 – 2015-04-16 (×12): 1 via TOPICAL
  Filled 2015-04-09: qty 14

## 2015-04-09 MED ORDER — PIPERACILLIN-TAZOBACTAM 3.375 G IVPB 30 MIN
3.3750 g | Freq: Three times a day (TID) | INTRAVENOUS | Status: DC
  Administered 2015-04-09: 3.375 g via INTRAVENOUS
  Filled 2015-04-09 (×3): qty 50

## 2015-04-09 MED ORDER — CALCIUM CARBONATE-VITAMIN D 600-400 MG-UNIT PO TABS: 1.0000 | ORAL_TABLET | Freq: Two times a day (BID) | ORAL | Status: DC

## 2015-04-09 MED ORDER — FUROSEMIDE 10 MG/ML IJ SOLN
40.0000 mg | Freq: Once | INTRAMUSCULAR | Status: AC
  Administered 2015-04-09: 12:00:00 40 mg via INTRAVENOUS
  Filled 2015-04-09: qty 4

## 2015-04-09 MED ORDER — FUROSEMIDE 10 MG/ML IJ SOLN
20.0000 mg | Freq: Two times a day (BID) | INTRAMUSCULAR | Status: DC
  Administered 2015-04-10 (×2): 20 mg via INTRAVENOUS
  Filled 2015-04-09 (×2): qty 2

## 2015-04-09 MED ORDER — SODIUM CHLORIDE 0.9 % IV SOLN
Freq: Once | INTRAVENOUS | Status: AC
  Administered 2015-04-09: 03:00:00 via INTRAVENOUS

## 2015-04-09 MED ORDER — VANCOMYCIN HCL IN DEXTROSE 1-5 GM/200ML-% IV SOLN
1000.0000 mg | Freq: Once | INTRAVENOUS | Status: AC
  Administered 2015-04-09: 1000 mg via INTRAVENOUS
  Filled 2015-04-09: qty 200

## 2015-04-09 MED ORDER — ONDANSETRON HCL 4 MG/2ML IJ SOLN: 4.0000 mg | Freq: Four times a day (QID) | INTRAMUSCULAR | Status: DC | PRN

## 2015-04-09 MED ORDER — MORPHINE SULFATE (PF) 2 MG/ML IV SOLN
2.0000 mg | INTRAVENOUS | Status: DC | PRN
  Filled 2015-04-09: qty 1

## 2015-04-09 MED ORDER — INSULIN ASPART 100 UNIT/ML ~~LOC~~ SOLN
0.0000 [IU] | Freq: Three times a day (TID) | SUBCUTANEOUS | Status: DC
  Administered 2015-04-09: 17:00:00 2 [IU] via SUBCUTANEOUS
  Administered 2015-04-09: 3 [IU] via SUBCUTANEOUS
  Administered 2015-04-10 (×3): 1 [IU] via SUBCUTANEOUS
  Administered 2015-04-11 (×2): 2 [IU] via SUBCUTANEOUS
  Administered 2015-04-11: 17:00:00 1 [IU] via SUBCUTANEOUS
  Administered 2015-04-12 (×3): 2 [IU] via SUBCUTANEOUS
  Administered 2015-04-13: 5 [IU] via SUBCUTANEOUS
  Administered 2015-04-13 (×2): 3 [IU] via SUBCUTANEOUS
  Administered 2015-04-14: 17:00:00 5 [IU] via SUBCUTANEOUS
  Administered 2015-04-14: 09:00:00 3 [IU] via SUBCUTANEOUS
  Administered 2015-04-14 – 2015-04-15 (×3): 5 [IU] via SUBCUTANEOUS
  Administered 2015-04-15: 3 [IU] via SUBCUTANEOUS
  Administered 2015-04-16: 09:00:00 2 [IU] via SUBCUTANEOUS
  Administered 2015-04-16: 12:00:00 3 [IU] via SUBCUTANEOUS
  Filled 2015-04-09: qty 5
  Filled 2015-04-09: qty 2
  Filled 2015-04-09: qty 5
  Filled 2015-04-09: qty 1
  Filled 2015-04-09 (×2): qty 2
  Filled 2015-04-09: qty 1
  Filled 2015-04-09: qty 5
  Filled 2015-04-09: qty 1
  Filled 2015-04-09: qty 2
  Filled 2015-04-09: qty 3
  Filled 2015-04-09: qty 5
  Filled 2015-04-09: qty 3
  Filled 2015-04-09: qty 5
  Filled 2015-04-09 (×3): qty 3
  Filled 2015-04-09 (×2): qty 1
  Filled 2015-04-09: qty 3
  Filled 2015-04-09 (×3): qty 2
  Filled 2015-04-09: qty 3

## 2015-04-09 MED ORDER — SODIUM CHLORIDE 0.9 % IV BOLUS (SEPSIS)
1000.0000 mL | INTRAVENOUS | Status: DC
  Administered 2015-04-09: 1000 mL via INTRAVENOUS

## 2015-04-09 MED ORDER — PIPERACILLIN-TAZOBACTAM 3.375 G IVPB
3.3750 g | Freq: Three times a day (TID) | INTRAVENOUS | Status: DC
  Administered 2015-04-09 – 2015-04-11 (×7): 3.375 g via INTRAVENOUS
  Filled 2015-04-09 (×10): qty 50

## 2015-04-09 MED ORDER — CLONIDINE HCL 0.1 MG PO TABS
0.1000 mg | ORAL_TABLET | Freq: Two times a day (BID) | ORAL | Status: DC
  Administered 2015-04-09: 0.1 mg via ORAL
  Filled 2015-04-09: qty 1

## 2015-04-09 MED ORDER — ACETAMINOPHEN 650 MG RE SUPP: 650.0000 mg | Freq: Four times a day (QID) | RECTAL | Status: DC | PRN

## 2015-04-09 MED ORDER — VANCOMYCIN HCL IN DEXTROSE 1-5 GM/200ML-% IV SOLN
1000.0000 mg | INTRAVENOUS | Status: DC
  Administered 2015-04-09 – 2015-04-10 (×3): 1000 mg via INTRAVENOUS
  Filled 2015-04-09 (×4): qty 200

## 2015-04-09 MED ORDER — MELOXICAM 7.5 MG PO TABS
7.5000 mg | ORAL_TABLET | Freq: Every day | ORAL | Status: DC
  Administered 2015-04-09 – 2015-04-16 (×8): 7.5 mg via ORAL
  Filled 2015-04-09 (×8): qty 1

## 2015-04-09 MED ORDER — CALCIUM CARBONATE-VITAMIN D 500-200 MG-UNIT PO TABS
1.0000 | ORAL_TABLET | Freq: Two times a day (BID) | ORAL | Status: DC
  Administered 2015-04-09 – 2015-04-16 (×13): 1 via ORAL
  Filled 2015-04-09 (×15): qty 1

## 2015-04-09 MED ORDER — OCUVITE-LUTEIN PO CAPS
1.0000 | ORAL_CAPSULE | Freq: Every day | ORAL | Status: DC
  Administered 2015-04-09 – 2015-04-16 (×8): 1 via ORAL
  Filled 2015-04-09 (×8): qty 1

## 2015-04-09 MED ORDER — SERTRALINE HCL 50 MG PO TABS
100.0000 mg | ORAL_TABLET | Freq: Every day | ORAL | Status: DC
  Administered 2015-04-09: 12:00:00 100 mg via ORAL
  Filled 2015-04-09: qty 2

## 2015-04-09 MED ORDER — ALPRAZOLAM 0.25 MG PO TABS
0.2500 mg | ORAL_TABLET | Freq: Two times a day (BID) | ORAL | Status: DC | PRN
  Administered 2015-04-09 – 2015-04-16 (×8): 0.25 mg via ORAL
  Filled 2015-04-09 (×8): qty 1

## 2015-04-09 MED ORDER — PNEUMOCOCCAL VAC POLYVALENT 25 MCG/0.5ML IJ INJ
0.5000 mL | INJECTION | INTRAMUSCULAR | Status: AC
  Administered 2015-04-11: 0.5 mL via INTRAMUSCULAR
  Filled 2015-04-09: qty 0.5

## 2015-04-09 MED ORDER — PANTOPRAZOLE SODIUM 40 MG PO TBEC
40.0000 mg | DELAYED_RELEASE_TABLET | Freq: Every day | ORAL | Status: DC
  Administered 2015-04-09 – 2015-04-16 (×8): 40 mg via ORAL
  Filled 2015-04-09 (×8): qty 1

## 2015-04-09 MED ORDER — OXYCODONE HCL 5 MG PO TABS
5.0000 mg | ORAL_TABLET | ORAL | Status: DC | PRN
  Administered 2015-04-10 – 2015-04-14 (×3): 5 mg via ORAL
  Filled 2015-04-09 (×3): qty 1

## 2015-04-09 MED ORDER — LOSARTAN POTASSIUM 50 MG PO TABS
100.0000 mg | ORAL_TABLET | Freq: Every day | ORAL | Status: DC
  Administered 2015-04-09 – 2015-04-16 (×8): 100 mg via ORAL
  Filled 2015-04-09 (×8): qty 2

## 2015-04-09 MED ORDER — ACETAMINOPHEN 325 MG PO TABS
650.0000 mg | ORAL_TABLET | Freq: Four times a day (QID) | ORAL | Status: DC | PRN
  Administered 2015-04-10 – 2015-04-16 (×10): 650 mg via ORAL
  Filled 2015-04-09 (×10): qty 2

## 2015-04-09 MED ORDER — SODIUM CHLORIDE 0.9 % IJ SOLN
3.0000 mL | Freq: Two times a day (BID) | INTRAMUSCULAR | Status: DC
  Administered 2015-04-09 – 2015-04-16 (×16): 3 mL via INTRAVENOUS

## 2015-04-09 MED ORDER — SERTRALINE HCL 50 MG PO TABS
150.0000 mg | ORAL_TABLET | Freq: Every day | ORAL | Status: DC
  Administered 2015-04-10 – 2015-04-16 (×7): 150 mg via ORAL
  Filled 2015-04-09 (×7): qty 3

## 2015-04-09 NOTE — Progress Notes (Signed)
ANTIBIOTIC CONSULT NOTE - INITIAL  Pharmacy Consult for vancomycin and Zosyn dosing Indication: sepsis  Allergies  Allergen Reactions  . Adhesive [Tape] Other (See Comments)    Nursing home records...unknown side effect  . Percocet [Oxycodone-Acetaminophen] Other (See Comments)    From nursing home records, unknown side effect    Patient Measurements: Height: 5\' 4"  (162.6 cm) Weight: 219 lb 5.7 oz (99.5 kg) IBW/kg (Calculated) : 54.7 Adjusted Body Weight: 73kg  Vital Signs: Temp: 100.6 F (38.1 C) (11/07 0111) Temp Source: Rectal (11/07 0111) BP: 135/54 mmHg (11/07 0300) Pulse Rate: 65 (11/07 0300) Intake/Output from previous day:   Intake/Output from this shift:    Labs:  Recent Labs  04/09/15 0100  WBC 14.8*  HGB 9.9*  PLT 174  CREATININE 1.04*   Estimated Creatinine Clearance: 45.3 mL/min (by C-G formula based on Cr of 1.04). No results for input(s): VANCOTROUGH, VANCOPEAK, VANCORANDOM, GENTTROUGH, GENTPEAK, GENTRANDOM, TOBRATROUGH, TOBRAPEAK, TOBRARND, AMIKACINPEAK, AMIKACINTROU, AMIKACIN in the last 72 hours.   Microbiology: No results found for this or any previous visit (from the past 720 hour(s)).  Medical History: Past Medical History  Diagnosis Date  . Mitral valve insufficiency   . Diabetes mellitus without complication ([place])   . Sleep apnea   . Hyperlipidemia   . Hypertension   . History of breast cancer   . Anemia   . Urinary incontinence   . Osteoarthritis   . GERD (gastroesophageal reflux disease)   . History of uterine cancer   . Non-Hodgkin's lymphoma ([place])   . Incontinence     Medications:   Assessment: UA: LE(+) NO2(+) WBC TNTC Blood and urine cx pending CXR: vascular congestion  Goal of Therapy:  Vancomycin trough level 15-20 mcg/ml  Plan:  TBW 99.5kg  IBW 54.7kg. DW 73kg  Vd 51L kei 0.046 hr-1  T1/2 16 hours Vancomycin 1 gram q 18 hours ordered with stacked dosing. Level before 5th dose.  Zosyn 3.375 grams q 8 hours  ordered.  Revanth Neidig S 04/09/2015,3:50 AM

## 2015-04-09 NOTE — Plan of Care (Signed)
Problem: Pain Managment: Goal: General experience of comfort will improve Outcome: Progressing Pt didn't c/o pain.   Problem: Physical Regulation: Goal: Will remain free from infection Outcome: Progressing Pt is on Vanc and zosyn for UTI and possible PNA.   Problem: Skin Integrity: Goal: Risk for impaired skin integrity will decrease Outcome: Progressing Pt is at risk for skin breakdown - she's incontinent and bedbound.  We turned her as much as possible but she doesn't tolerate this well.  We also put an allevyn dressing for prophylaxis on sacral area.   Problem: Tissue Perfusion: Goal: Risk factors for ineffective tissue perfusion will decrease Outcome: Progressing Pt is on O2 4L.   Problem: Nutrition: Goal: Adequate nutrition will be maintained Outcome: Progressing Pt will have modified swall test tomorrow.  Put on honey thick fluids.  Instructed to crush meds and administer in applesauce.

## 2015-04-09 NOTE — Evaluation (Signed)
Clinical/Bedside Swallow Evaluation Patient Details  Name: Sarah Orozco MRN: [id number] Date of Birth: 02-27-1928  Today's Date: 04/09/2015 Time: SLP Start Time (ACUTE ONLY): 1400 SLP Stop Time (ACUTE ONLY): 1500 SLP Time Calculation (min) (ACUTE ONLY): 60 min  Past Medical History:  Past Medical History  Diagnosis Date  . Mitral valve insufficiency   . Diabetes mellitus without complication ([place])   . Sleep apnea   . Hyperlipidemia   . Hypertension   . History of breast cancer   . Anemia   . Urinary incontinence   . Osteoarthritis   . GERD (gastroesophageal reflux disease)   . History of uterine cancer   . Non-Hodgkin's lymphoma ([place])   . Incontinence    Past Surgical History:  Past Surgical History  Procedure Laterality Date  . Total abdominal hysterectomy w/ bilateral salpingoophorectomy    . Breast lumpectomy      left   . Tonsillectomy    . Appendectomy    . Knee replacement  03/17/2000    right knee   . Carpal tunnel release  03/17/2000  . Endoscopic insertion peritoneal catheter port  2003  . Cardiac catheterization     HPI:  Pt states she is getting coughed up and choked on food and liquid. HPI: Sarah Orozco is a 81 y.o. female with a known history of hyperlipidemia unspecified, type 2 diabetes non-insulin-requiring presenting with shortness of breath. She is a somewhat limited historian. Apparently she has had shortness of breath for 4 days. She has associated cough productive as well as subjective fevers and chills. Given the above symptoms he is brought to the Hospital further workup and evaluation. Upon arrival she was noted to be hypoxic with oxygen saturations in the low 80s on room air she is subsequently placed on supplemental O2 with improvement of her oxygen saturations. Sepsis protocol was initiated given white count and temperature she received vancomycin/Zosyn for broad coverage.   Assessment / Plan / Recommendation Clinical Impression  Pt presents  with oral pharyngeal phase dysphagia c/b slightly prolonged oral transit time, suspected delayed swallow initiation, multiple swallows, throat clearing and coughing (the latter two symptoms observed both immediately and delayed after PO trials). Pt reports concern with her swallow stating she gets choked and coughs when she eats and drinks (for about the past month).  Chest x-ray supports a more conservative approach at this time. Given the amount of coughing and throat clearing observed by ST, pt would benefit from a puree diet with honey thick liquids and strict aspiration precautions with continued ST f/u for assessment of diet toleration and possible MBSS. Pt does not "like" the honey thickened water, but was educated on its safety at this time and agrees with this approach.  NSG updated.    Aspiration Risk  Moderate    Diet Recommendation Dysphagia 1 (Puree);Honey   Medication Administration: Crushed with puree (as able) Compensations: Slow rate;Small sips/bites;Minimize environmental distractions;Clear throat intermittently    Other  Recommendations Oral Care Recommendations: Oral care BID;Staff/trained caregiver to provide oral care Other Recommendations: Remove water pitcher   Follow Up Recommendations       Frequency and Duration min 3x week  1 week   Pertinent Vitals/Pain None reported    SLP Swallow Goals  See plan of care   Swallow Study Prior Functional Status   Pt previously consumed a heart healthy diet with thin liquids.    General Date of Onset: 04/09/15 Other Pertinent Information: Pt states she is getting coughed up  and choked on food and liquid. HPI: Sarah Orozco is a 81 y.o. female with a known history of hyperlipidemia unspecified, type 2 diabetes non-insulin-requiring presenting with shortness of breath. She is a somewhat limited historian. Apparently she has had shortness of breath for 4 days. She has associated cough productive as well as subjective fevers and  chills. Given the above symptoms he is brought to the Hospital further workup and evaluation. Upon arrival she was noted to be hypoxic with oxygen saturations in the low 80s on room air she is subsequently placed on supplemental O2 with improvement of her oxygen saturations. Sepsis protocol was initiated given white count and temperature she received vancomycin/Zosyn for broad coverage. Type of Study: Bedside swallow evaluation Diet Prior to this Study: Regular;Thin liquids Temperature Spikes Noted: No Respiratory Status: Room air History of Recent Intubation: No Behavior/Cognition: Alert;Cooperative;Pleasant mood Oral Cavity - Dentition: Adequate natural dentition/normal for age Self-Feeding Abilities: Able to feed self;Needs assist;Needs set up Patient Positioning: Upright in bed Baseline Vocal Quality: Normal Volitional Cough: Strong Volitional Swallow: Able to elicit    Oral/Motor/Sensory Function Overall Oral Motor/Sensory Function: Appears within functional limits for tasks assessed (as observed in bolus management)   Ice Chips Ice chips: Impaired Presentation: Spoon Oral Phase Functional Implications: Prolonged oral transit Pharyngeal Phase Impairments: Throat Clearing - Immediate;Suspected delayed Swallow Other Comments: Pt throat cleared on 2/6 trials of single ice chips   Thin Liquid Thin Liquid: Impaired Presentation: Cup Pharyngeal  Phase Impairments: Multiple swallows;Throat Clearing - Immediate;Throat Clearing - Delayed;Cough - Immediate;Cough - Delayed Other Comments: Pt took two small sips of water (one at a time) and demonstrated immediate and delayed throat clearing and coughing.    Nectar Thick Nectar Thick Liquid: Impaired Pharyngeal Phase Impairments: Suspected delayed Swallow;Multiple swallows;Throat Clearing - Immediate;Throat Clearing - Delayed;Cough - Immediate;Cough - Delayed Other Comments: Pt consumed 8 trials of nectar and demonstrated throat clearing and  coughing intermittently throughout, until last trial after which she had a coughing episode.   Honey Thick Honey Thick Liquid: Within functional limits Presentation: Spoon Other Comments: Pt tolerated tsp trials of honey thick liquid with no immediate or overt s/s of aspiration.   Puree Puree: Within functional limits Presentation: Spoon Other Comments: Pt tolerated limited trials of applesauce (4 total)   Solid   GO    Solid: Not tested       Zanyiah Posten 04/09/2015,3:35 PM

## 2015-04-09 NOTE — H&P (Signed)
[facility] NAME: Sarah Orozco    MR#:  [id number]  DATE OF BIRTH:  [birth year]   DATE OF ADMISSION:  04/09/2015  PRIMARY CARE PHYSICIAN: Marinda Elk, MD   REQUESTING/REFERRING PHYSICIAN: Karma Greaser  CHIEF COMPLAINT:   Chief Complaint  Patient presents with  . Shortness of Breath    HISTORY OF PRESENT ILLNESS:  Sarah Orozco  is a 81 y.o. female with a known history of hyperlipidemia unspecified, type 2 diabetes non-insulin-requiring presenting with shortness of breath. She is a somewhat limited historian. Apparently she has had shortness of breath for 4 days. She has associated cough productive as well as subjective fevers and chills. Given the above symptoms he is brought to the Hospital further workup and evaluation. Upon arrival she was noted to be hypoxic with oxygen saturations in the low 80s on room air she is subsequently placed on supplemental O2 with improvement of her oxygen saturations. Sepsis protocol was initiated given white count and temperature she received vancomycin/Zosyn for broad coverage.  PAST MEDICAL HISTORY:   Past Medical History  Diagnosis Date  . Mitral valve insufficiency   . Diabetes mellitus without complication ([place])   . Sleep apnea   . Hyperlipidemia   . Hypertension   . History of breast cancer   . Anemia   . Urinary incontinence   . Osteoarthritis   . GERD (gastroesophageal reflux disease)   . History of uterine cancer   . Non-Hodgkin's lymphoma ([place])   . Incontinence     PAST SURGICAL HISTORY:   Past Surgical History  Procedure Laterality Date  . Total abdominal hysterectomy w/ bilateral salpingoophorectomy    . Breast lumpectomy      left   . Tonsillectomy    . Appendectomy    . Knee replacement  03/17/2000    right knee   . Carpal tunnel release  03/17/2000  . Endoscopic insertion peritoneal catheter port  2003  . Cardiac catheterization      SOCIAL  HISTORY:   Social History  Substance Use Topics  . Smoking status: Never Smoker   . Smokeless tobacco: Not on file  . Alcohol Use: No    FAMILY HISTORY:   Family History  Problem Relation Age of Onset  . Hypertension Other     DRUG ALLERGIES:   Allergies  Allergen Reactions  . Adhesive [Tape] Other (See Comments)    Nursing home records...unknown side effect  . Percocet [Oxycodone-Acetaminophen] Other (See Comments)    From nursing home records, unknown side effect    REVIEW OF SYSTEMS:  REVIEW OF SYSTEMS:  CONSTITUTIONAL: Positive fevers, chills, fatigue, weakness.  EYES: Denies blurred vision, double vision, or eye pain.  EARS, NOSE, THROAT: Denies tinnitus, ear pain, hearing loss.  RESPIRATORY: Positive cough, shortness of breath, denies wheezing  CARDIOVASCULAR: Denies chest pain, palpitations, edema.  GASTROINTESTINAL: Denies nausea, vomiting, diarrhea, abdominal pain.  GENITOURINARY: Denies dysuria, hematuria.  ENDOCRINE: Denies nocturia or thyroid problems. HEMATOLOGIC AND LYMPHATIC: Denies easy bruising or bleeding.  SKIN: Denies rash or lesions.  MUSCULOSKELETAL: Denies pain in neck, back, shoulder, knees, hips, or further arthritic symptoms.  NEUROLOGIC: Denies paralysis, paresthesias.  PSYCHIATRIC: Denies anxiety or depressive symptoms. Otherwise full review of systems performed by me is negative.   MEDICATIONS AT HOME:   Prior to Admission medications   Medication Sig Start Date End Date Taking? Authorizing Provider  acetaminophen (TYLENOL) 325 MG tablet Take 325 mg by mouth every  6 (six) hours as needed.  04/05/14  Yes Historical Provider, MD  ALPRAZolam Duanne Moron) 0.25 MG tablet Take 0.25 mg by mouth 2 (two) times daily as needed for anxiety.   Yes Historical Provider, MD  amoxicillin (AMOXIL) 500 MG capsule Take 4 tablets 1 hour prior to dental procedures.   Yes Historical Provider, MD  Calcium Carbonate-Vitamin D (CALCIUM 600+D) 600-400 MG-UNIT per  tablet Take 1 tablet by mouth 2 (two) times daily.   Yes Historical Provider, MD  cloNIDine (CATAPRES) 0.1 MG tablet Take 1 tablet (0.1 mg total) by mouth 2 (two) times daily. 03/21/15  Yes Minna Merritts, MD  Elastic Bandages & Supports (T.E.D. BELOW KNEE/S-REGULAR) MISC by Does not apply route.   Yes Historical Provider, MD  glipiZIDE (GLUCOTROL) 5 MG tablet Take 2.5 mg by mouth daily before breakfast.   Yes Historical Provider, MD  hydrochlorothiazide (HYDRODIURIL) 25 MG tablet Take 1 tablet (25 mg total) by mouth daily. 07/03/14  Yes Minna Merritts, MD  losartan (COZAAR) 50 MG tablet Take 2 tablets (100 mg total) by mouth daily. 11/16/14  Yes Minna Merritts, MD  meloxicam (MOBIC) 7.5 MG tablet Take 7.5 mg by mouth daily.   Yes Historical Provider, MD  multivitamin-lutein (OCUVITE-LUTEIN) CAPS capsule Take 1 capsule by mouth daily.   Yes Historical Provider, MD  nystatin (MYCOSTATIN/NYSTOP) 100000 UNIT/GM POWD Apply 1-5 g topically 3 (three) times daily.   Yes Historical Provider, MD  omeprazole (PRILOSEC) 20 MG capsule Take 20 mg by mouth daily.   Yes Historical Provider, MD  sertraline (ZOLOFT) 100 MG tablet Take 150 mg by mouth daily.    Yes Historical Provider, MD      VITAL SIGNS:  Blood pressure 135/52, pulse 65, temperature 100.6 F (38.1 C), temperature source Rectal, resp. rate 14, height 5\' 4"  (1.626 m), weight 219 lb 5.7 oz (99.5 kg), SpO2 100 %.  PHYSICAL EXAMINATION:  VITAL SIGNS: Filed Vitals:   04/09/15 0150  BP: 135/52  Pulse: 65  Temp:   Resp: 14   GENERAL:81 y.o.female currently in moderate acute distress. Given respiratory status HEAD: Normocephalic, atraumatic.  EYES: Pupils equal, round, reactive to light. Extraocular muscles intact. No scleral icterus.  MOUTH: Moist mucosal membrane. Dentition intact. No abscess noted.  EAR, NOSE, THROAT: Clear without exudates. No external lesions.  NECK: Supple. No thyromegaly. No nodules. No JVD.  PULMONARY: Coarse  breath sounds with scattered rhonchi  without wheezeNo use of accessory muscles, Good respiratory effort. good air entry bilaterally CHEST: Nontender to palpation.  CARDIOVASCULAR: S1 and S2. Regular rate and rhythm. No murmurs, rubs, or gallops. No edema. Pedal pulses 2+ bilaterally.  GASTROINTESTINAL: Soft, nontender, nondistended. No masses. Positive bowel sounds. No hepatosplenomegaly.  MUSCULOSKELETAL: No swelling, clubbing, or edema. Range of motion full in all extremities.  NEUROLOGIC: Cranial nerves II through XII are intact. No gross focal neurological deficits. Sensation intact. Reflexes intact.  SKIN: No ulceration, lesions, rashes, or cyanosis. Skin warm and dry. Turgor intact.  PSYCHIATRIC: Mood, affect within normal limits. The patient is awake, alert and oriented x 3. Insight, judgment intact.    LABORATORY PANEL:   CBC  Recent Labs Lab 04/09/15 0100  WBC 14.8*  HGB 9.9*  HCT 30.7*  PLT 174   ------------------------------------------------------------------------------------------------------------------  Chemistries   Recent Labs Lab 04/09/15 0100  NA 138  K 4.2  CL 103  CO2 32  GLUCOSE 155*  BUN 24*  CREATININE 1.04*  CALCIUM 8.9  AST 20  ALT 17  ALKPHOS  81  BILITOT 1.0   ------------------------------------------------------------------------------------------------------------------  Cardiac Enzymes  Recent Labs Lab 04/09/15 0100  TROPONINI <0.03   ------------------------------------------------------------------------------------------------------------------  RADIOLOGY:  Dg Chest Port 1 View  04/09/2015  CLINICAL DATA:  Difficulty breathing, cough, and congestion and weakness for a few days. History of diabetes, hypertension, hyperlipidemia, breast cancer, non-Hodgkin's lymphoma. EXAM: PORTABLE CHEST 1 VIEW COMPARISON:  Chest radiograph March 16, 2014 FINDINGS: The cardiac silhouette is mildly enlarged, unchanged. Calcified tortuous  aorta most often seen with hypertension. Pulmonary vascular congestion. Slight blunting of costophrenic angles. No focal consolidation. No pneumothorax. Soft tissue planes and included osseous structures are nonsuspicious. Surgical clips project in LEFT axilla. Additional punctate foreign bodies projecting at the thoracic and are likely external to the patient. IMPRESSION: Mild cardiomegaly and pulmonary vascular congestion. Minimal pleural thickening, less likely pleural effusions. Electronically Signed   By: Elon Alas M.D.   On: 04/09/2015 01:58    EKG:   Orders placed or performed during the hospital encounter of 04/09/15  . EKG 12-Lead  . EKG 12-Lead    IMPRESSION AND PLAN:   81 year old Caucasian female history of essential hypertension presenting with shortness of breath.  1.Sepsis, meeting septic criteria by temperature, leukocytosis present on arrival. Source unclear etiology (UTI versus pneumonia) source Code sepsis initiated. Panculture. Broad-spectrum antibiotics including vancomycin/Zosyn initiated in emergency department and taper antibiotics when culture data returns.  Continue IV fluid hydration to keep mean arterial pressure greater than 65. He may require pressor therapy if blood pressure worsens. Caution with IV fluid hydration given pulmonary edema  2. Essential hypertension: Continue home medications included Catapres Cozaar 3. Type 2 diabetes non-insulin-requiring: Hold oral agents at insulin sliding scale 4. GERD without esophagitis: PPI therapy     All the records are reviewed and case discussed with ED provider. Management plans discussed with the patient, family and they are in agreement.  CODE STATUS: Full  TOTAL TIME TAKING CARE OF THIS PATIENT: 35 minutes.    Samiyyah Moffa,  Karenann Cai.D on 04/09/2015 at 3:15 AM  Between 7am to 6pm - Pager - [phone]  After 6pm: House Pager: - [phone]  [facility]  Office   [phone]  CC: Primary care physician; Marinda Elk, MD

## 2015-04-09 NOTE — ED Provider Notes (Signed)
[facility] Emergency Department Provider Note  ____________________________________________  Time seen: Approximately 1:02 AM  I have reviewed the triage vital signs and the nursing notes.   HISTORY  Chief Complaint Shortness of Breath  The patient seems to be confused/delirious  HPI Sarah Orozco is a 81 y.o. female with past medical history that includes morbid obesity, diabetes, sleep apnea, hypertension who presents by EMS with acute respiratory distress.  The patient states that she has been feeling increasingly worse over the last 3 days with worsening shortness of breath, congestion, and cough.  This became severe today at her nursing facilityand the facility called EMS out tonight for her severe difficulty breathing.  EMS reports that her room air saturation was 80%.  It improved to 98% on 6 L of O2 but the patient continues to look ill and have a frequent thick cough.  She also has significant upper respiratory and nasal congestion.  Overall her symptoms are severe and nothing seems to make them better or worse.  She denies chest pain, abdominal pain, nausea/vomiting.   Past Medical History  Diagnosis Date  . Mitral valve insufficiency   . Diabetes mellitus without complication ([place])   . Sleep apnea   . Hyperlipidemia   . Hypertension   . History of breast cancer   . Anemia   . Urinary incontinence   . Osteoarthritis   . GERD (gastroesophageal reflux disease)   . History of uterine cancer   . Non-Hodgkin's lymphoma ([place])   . Incontinence     Patient Active Problem List   Diagnosis Date Noted  . Sepsis ([place]) 04/09/2015  . SOB (shortness of breath) 07/03/2014  . Essential hypertension 07/03/2014  . Morbid obesity ([place]) 07/03/2014  . Poorly controlled type 2 diabetes mellitus ([place]) 07/03/2014  . Incontinence 07/03/2014  . Obstructive sleep apnea 07/03/2014  . Chronic diastolic CHF (congestive heart failure) ([place]) 07/03/2014    Past  Surgical History  Procedure Laterality Date  . Total abdominal hysterectomy w/ bilateral salpingoophorectomy    . Breast lumpectomy      left   . Tonsillectomy    . Appendectomy    . Knee replacement  03/17/2000    right knee   . Carpal tunnel release  03/17/2000  . Endoscopic insertion peritoneal catheter port  2003  . Cardiac catheterization      Current Outpatient Rx  Name  Route  Sig  Dispense  Refill  . acetaminophen (TYLENOL) 325 MG tablet   Oral   Take 325 mg by mouth every 6 (six) hours as needed.          . ALPRAZolam (XANAX) 0.25 MG tablet   Oral   Take 0.25 mg by mouth 2 (two) times daily as needed for anxiety.         Marland Kitchen amoxicillin (AMOXIL) 500 MG capsule      Take 4 tablets 1 hour prior to dental procedures.         . Calcium Carbonate-Vitamin D (CALCIUM 600+D) 600-400 MG-UNIT per tablet   Oral   Take 1 tablet by mouth 2 (two) times daily.         . cloNIDine (CATAPRES) 0.1 MG tablet   Oral   Take 1 tablet (0.1 mg total) by mouth 2 (two) times daily.   60 tablet   3   . Elastic Bandages & Supports (T.E.D. BELOW KNEE/S-REGULAR) MISC   Does not apply   by Does not apply route.         Marland Kitchen  glipiZIDE (GLUCOTROL) 5 MG tablet   Oral   Take 2.5 mg by mouth daily before breakfast.         . hydrochlorothiazide (HYDRODIURIL) 25 MG tablet   Oral   Take 1 tablet (25 mg total) by mouth daily.   30 tablet   11   . losartan (COZAAR) 50 MG tablet   Oral   Take 2 tablets (100 mg total) by mouth daily.   60 tablet   6   . meloxicam (MOBIC) 7.5 MG tablet   Oral   Take 7.5 mg by mouth daily.         . multivitamin-lutein (OCUVITE-LUTEIN) CAPS capsule   Oral   Take 1 capsule by mouth daily.         Marland Kitchen nystatin (MYCOSTATIN/NYSTOP) 100000 UNIT/GM POWD   Topical   Apply 1-5 g topically 3 (three) times daily.         Marland Kitchen omeprazole (PRILOSEC) 20 MG capsule   Oral   Take 20 mg by mouth daily.         . sertraline (ZOLOFT) 100 MG tablet    Oral   Take 150 mg by mouth daily.            Allergies Adhesive and Percocet  Family History  Problem Relation Age of Onset  . Hypertension Other     Social History Social History  Substance Use Topics  . Smoking status: Never Smoker   . Smokeless tobacco: None  . Alcohol Use: No    Review of Systems  Unable to obtain due to the patient's respiratory distress and decreased mental status  ____________________________________________   PHYSICAL EXAM:  ED Triage Vitals  Enc Vitals Group     BP 04/09/15 0150 135/52 mmHg     Pulse Rate 04/09/15 0150 65     Resp 04/09/15 0150 14     Temp 04/09/15 0111 100.6 F (38.1 C)     Temp Source 04/09/15 0111 Rectal     SpO2 04/09/15 0150 100 %     Weight 04/09/15 0112 219 lb 5.7 oz (99.5 kg)     Height 04/09/15 0112 5\' 4"  (1.626 m)     Head Cir --      Peak Flow --      Pain Score --      Pain Loc --      Pain Edu? --      Excl. in [place]? --      Constitutional: Awake and responds to simple questions, but appears somnolent and is ill-appearing upon arrival Eyes: Conjunctivae are injected with purulent discharge from the left eye. PERRL. EOMI. Head: Atraumatic. Nose: Congestion Mouth/Throat: Mucous membranes are dry.   Neck: No stridor.  No meningismus Cardiovascular: Normal rate, regular rhythm. Grossly normal heart sounds.  Good peripheral circulation. Respiratory: Increased respiratory effort and rate.  Requip thick cough.  Coarse breath sounds at bases but exam is limited by the patient's habitus Gastrointestinal: Morbidly obese, Soft and nontender. No distention.  Musculoskeletal: No lower extremity tenderness nor edema.  No joint effusions. Neurologic: No gross focal neurologic deficits are appreciated.  Skin:  Skin is warm to the point of feeling febrile, dry and intact. No rash noted.  Pale.   ____________________________________________   LABS (all labs ordered are listed, but only abnormal results are  displayed)  Labs Reviewed  COMPREHENSIVE METABOLIC PANEL - Abnormal; Notable for the following:    Glucose, Bld 155 (*)    BUN 24 (*)  Creatinine, Ser 1.04 (*)    Total Protein 6.1 (*)    Albumin 3.1 (*)    GFR calc non Af Amer 48 (*)    GFR calc Af Amer 55 (*)    Anion gap 3 (*)    All other components within normal limits  CBC WITH DIFFERENTIAL/PLATELET - Abnormal; Notable for the following:    WBC 14.8 (*)    Hemoglobin 9.9 (*)    HCT 30.7 (*)    MCH 25.8 (*)    RDW 15.4 (*)    Neutro Abs 12.2 (*)    Monocytes Absolute 1.1 (*)    All other components within normal limits  BLOOD GAS, ARTERIAL - Abnormal; Notable for the following:    pCO2 arterial 56 (*)    Bicarbonate 35.0 (*)    Acid-Base Excess 9.2 (*)    All other components within normal limits  URINALYSIS COMPLETEWITH MICROSCOPIC ([facility] ONLY) - Abnormal; Notable for the following:    Color, Urine YELLOW (*)    APPearance CLOUDY (*)    Nitrite POSITIVE (*)    Leukocytes, UA 3+ (*)    Bacteria, UA MANY (*)    Squamous Epithelial / LPF 6-30 (*)    All other components within normal limits  CULTURE, BLOOD (ROUTINE X 2)  CULTURE, BLOOD (ROUTINE X 2)  URINE CULTURE  LACTIC ACID, PLASMA  LIPASE, BLOOD  TROPONIN I  APTT  PROTIME-INR  LACTIC ACID, PLASMA   ____________________________________________  EKG  ED ECG REPORT I, Zayin Valadez, the attending physician, personally viewed and interpreted this ECG.  Date: 04/09/2015 EKG Time: 1:05 Rate: 72 Rhythm: normal sinus rhythm QRS Axis: normal Intervals: normal ST/T Wave abnormalities: normal Conduction Disutrbances: none Narrative Interpretation: unremarkable  ____________________________________________  RADIOLOGY   Dg Chest Port 1 View  04/09/2015  CLINICAL DATA:  Difficulty breathing, cough, and congestion and weakness for a few days. History of diabetes, hypertension, hyperlipidemia, breast cancer, non-Hodgkin's lymphoma. EXAM: PORTABLE CHEST 1  VIEW COMPARISON:  Chest radiograph March 16, 2014 FINDINGS: The cardiac silhouette is mildly enlarged, unchanged. Calcified tortuous aorta most often seen with hypertension. Pulmonary vascular congestion. Slight blunting of costophrenic angles. No focal consolidation. No pneumothorax. Soft tissue planes and included osseous structures are nonsuspicious. Surgical clips project in LEFT axilla. Additional punctate foreign bodies projecting at the thoracic and are likely external to the patient. IMPRESSION: Mild cardiomegaly and pulmonary vascular congestion. Minimal pleural thickening, less likely pleural effusions. Electronically Signed   By: Elon Alas M.D.   On: 04/09/2015 01:58    ____________________________________________   PROCEDURES  Procedure(s) performed: None  Critical Care performed: Yes, see critical care note(s)   CRITICAL CARE Performed by: Hinda Kehr  Total critical care time: 30 minutes  Critical care time was exclusive of separately billable procedures and treating other patients.  Critical care was necessary to treat or prevent imminent or life-threatening deterioration.  Critical care was time spent personally by me on the following activities: development of treatment plan with patient and/or surrogate as well as nursing, discussions with consultants, evaluation of patient's response to treatment, examination of patient, obtaining history from patient or surrogate, ordering and performing treatments and interventions, ordering and review of laboratory studies, ordering and review of radiographic studies, pulse oximetry and re-evaluation of patient's condition.    ____________________________________________   INITIAL IMPRESSION / ASSESSMENT AND PLAN / ED COURSE  Pertinent labs & imaging results that were available during my care of the patient were reviewed by me and  considered in my medical decision making (see chart for details).  The patient is  ill-appearing and appears septic with room air sats of 80% prior to arrival and difficulty breathing.  I will perform a full septic workup including empiric antibiotics for presumed healthcare associated pneumonia, aggressive IV fluids, and standard lab workup.  ----------------------------------------- 3:27 AM on 04/09/2015 -----------------------------------------  No obvious lobar pneumonia on chest x-ray, but I strongly suspect she has pneumonia based on her lung exam, the thick frequent cough, and the degree of hypoxemia originally noted.  She has a complicated urinary tract infection and meet sepsis criteria, particularly with the altered mental status.  However with a lactate of 1 and her pulmonary vascular congestion seen on x-ray I will decrease her fluids down to a rate of 100 mL an hour.  I am admitting her for further management of her sepsis.  ____________________________________________  FINAL CLINICAL IMPRESSION(S) / ED DIAGNOSES  Final diagnoses:  Sepsis, due to unspecified organism ([place])  Complicated UTI (urinary tract infection)  Acute respiratory failure with hypoxia and hypercapnia ([facility])  Pulmonary vascular congestion      NEW MEDICATIONS STARTED DURING THIS VISIT:  New Prescriptions   No medications on file     Hinda Kehr, MD 04/09/15 [phone]

## 2015-04-09 NOTE — Care Management Important Message (Signed)
Important Message  Patient Details  Name: [name] MRN: [id number] Date of Birth: 05/21/1928   Medicare Important Message Given:  Yes-second notification given    Shelbie Ammons, RN 04/09/2015, 1:38 PM

## 2015-04-09 NOTE — Progress Notes (Signed)
*  PRELIMINARY RESULTS* Echocardiogram 2D Echocardiogram has been performed.  Sarah Orozco Stills 04/09/2015, 5:13 PM

## 2015-04-09 NOTE — Plan of Care (Signed)
Problem: Nutrition: Goal: Adequate nutrition will be maintained Outcome: [facility]. No complaints of pain. Uses Wheelchair at facility. Continues ABX.

## 2015-04-09 NOTE — Progress Notes (Signed)
[place] at [facility]                                                                                                                                                                                            Patient Demographics   Sarah Orozco, is a 81 y.o. female, DOB - Aug 13, 1927, NKN:[id number]  Admit date - 04/09/2015   Admitting Physician Lytle Butte, MD  Outpatient Primary MD for the patient is MCLAUGHLIN, MIRIAM K, MD   LOS - 0  Subjective: Patient complains of shortness of breath but no chest pain is very weak     Review of Systems:   CONSTITUTIONAL: No documented fever. Positive fatigue and weakness. No weight gain, no weight loss.  EYES: No blurry or double vision.  ENT: No tinnitus. No postnasal drip. No redness of the oropharynx.  RESPIRATORY: No cough, no wheeze, no hemoptysis. Positive dyspnea.  CARDIOVASCULAR: No chest pain. No orthopnea. No palpitations. No syncope.  GASTROINTESTINAL: No nausea, no vomiting or diarrhea. No abdominal pain. No melena or hematochezia.  GENITOURINARY: No dysuria or hematuria.  ENDOCRINE: No polyuria or nocturia. No heat or cold intolerance.  HEMATOLOGY: No anemia. No bruising. No bleeding.  INTEGUMENTARY: No rashes. No lesions.  MUSCULOSKELETAL: No arthritis. No swelling. No gout.  NEUROLOGIC: No numbness, tingling, or ataxia. No seizure-type activity.  PSYCHIATRIC: No anxiety. No insomnia. No ADD.    Vitals:   Filed Vitals:   04/09/15 0300 04/09/15 0330 04/09/15 0400 04/09/15 0512  BP: 135/54 113/51 131/65 123/56  Pulse: 65 57 59 60  Temp:   98.3 F (36.8 C) 98 F (36.7 C)  TempSrc:   Oral Oral  Resp: 17 19 22 20   Height:    5\' 4"  (1.626 m)  Weight:    100.154 kg (220 lb 12.8 oz)  SpO2: 99% 96% 98% 96%    Wt Readings from Last 3 Encounters:  04/09/15 100.154 kg (220 lb 12.8 oz)  10/02/14 103.562 kg (228 lb 5 oz)  07/03/14 93.441 kg (206 lb)    No intake or output data in the  24 hours ending 04/09/15 1304  Physical Exam:   GENERAL: Pleasant-appearing in no apparent distress.  HEAD, EYES, EARS, NOSE AND THROAT: Atraumatic, normocephalic. Extraocular muscles are intact. Pupils equal and reactive to light. Sclerae anicteric. No conjunctival injection. No oro-pharyngeal erythema.  NECK: Supple. There is no jugular venous distention. No bruits, no lymphadenopathy, no thyromegaly.  HEART: Regular rate and rhythm,. No murmurs, no rubs, no clicks.  LUNGS:  Bilateral crackles at the basis ABDOMEN: Soft, flat, nontender, nondistended. Has good bowel sounds.  No hepatosplenomegaly appreciated.  EXTREMITIES: No evidence of any cyanosis, clubbing, or peripheral edema.  +2 pedal and radial pulses bilaterally.  NEUROLOGIC: The patient is alert, awake, and oriented x3 with no focal motor or sensory deficits appreciated bilaterally.  SKIN: Moist and warm with no rashes appreciated.  Psych: Not anxious, depressed LN: No inguinal LN enlargement    Antibiotics   Anti-infectives    Start     Dose/Rate Route Frequency Ordered Stop   04/09/15 0900  vancomycin (VANCOCIN) IVPB 1000 mg/200 mL premix     1,000 mg 200 mL/hr over 60 Minutes Intravenous Every 18 hours 04/09/15 0349     04/09/15 0600  piperacillin-tazobactam (ZOSYN) IVPB 3.375 g     3.375 g 12.5 mL/hr over 240 Minutes Intravenous 3 times per day 04/09/15 0439     04/09/15 0115  vancomycin (VANCOCIN) IVPB 1000 mg/200 mL premix     1,000 mg 200 mL/hr over 60 Minutes Intravenous  Once 04/09/15 0100 04/09/15 0245   04/09/15 0115  piperacillin-tazobactam (ZOSYN) IVPB 3.375 g  Status:  Discontinued     3.375 g 100 mL/hr over 30 Minutes Intravenous 3 times per day 04/09/15 0100 04/09/15 0438      Medications   Scheduled Meds: . calcium-vitamin D  1 tablet Oral BID  . furosemide  20 mg Intravenous Q12H  . heparin  5,000 Units Subcutaneous 3 times per day  . insulin aspart  0-5 Units Subcutaneous QHS  . insulin aspart   0-9 Units Subcutaneous TID WC  . losartan  100 mg Oral Daily  . meloxicam  7.5 mg Oral Daily  . miconazole  1 application Topical BID  . multivitamin-lutein  1 capsule Oral Daily  . pantoprazole  40 mg Oral Daily  . piperacillin-tazobactam (ZOSYN)  IV  3.375 g Intravenous 3 times per day  . [START ON 04/10/2015] pneumococcal 23 valent vaccine  0.5 mL Intramuscular Tomorrow-1000  . sertraline  100 mg Oral Daily  . sodium chloride  3 mL Intravenous Q12H  . vancomycin  1,000 mg Intravenous Q18H   Continuous Infusions:  PRN Meds:.acetaminophen **OR** acetaminophen, ALPRAZolam, ipratropium-albuterol, morphine injection, ondansetron **OR** ondansetron (ZOFRAN) IV, oxyCODONE   Data Review:   Micro Results Recent Results (from the past 240 hour(s))  Blood Culture (routine x 2)     Status: None (Preliminary result)   Collection Time: 04/09/15  1:00 AM  Result Value Ref Range Status   Specimen Description BLOOD LEFT ARM  Final   Special Requests BOTTLES DRAWN AEROBIC AND ANAEROBIC 5CC  Final   Culture NO GROWTH < 12 HOURS  Final   Report Status PENDING  Incomplete  Blood Culture (routine x 2)     Status: None (Preliminary result)   Collection Time: 04/09/15  1:39 AM  Result Value Ref Range Status   Specimen Description BLOOD LEFT ASSIST CONTROL  Final   Special Requests BOTTLES DRAWN AEROBIC AND ANAEROBIC 3CC  Final   Culture NO GROWTH < 12 HOURS  Final   Report Status PENDING  Incomplete  MRSA PCR Screening     Status: None   Collection Time: 04/09/15  5:33 AM  Result Value Ref Range Status   MRSA by PCR NEGATIVE NEGATIVE Final    Comment:        The GeneXpert MRSA Assay (FDA approved for NASAL specimens only), is one component of a comprehensive MRSA colonization surveillance program. It is not intended to diagnose MRSA infection nor to guide or monitor treatment for MRSA  infections.     Radiology Reports Dg Chest Port 1 View  04/09/2015  CLINICAL DATA:  Difficulty  breathing, cough, and congestion and weakness for a few days. History of diabetes, hypertension, hyperlipidemia, breast cancer, non-Hodgkin's lymphoma. EXAM: PORTABLE CHEST 1 VIEW COMPARISON:  Chest radiograph March 16, 2014 FINDINGS: The cardiac silhouette is mildly enlarged, unchanged. Calcified tortuous aorta most often seen with hypertension. Pulmonary vascular congestion. Slight blunting of costophrenic angles. No focal consolidation. No pneumothorax. Soft tissue planes and included osseous structures are nonsuspicious. Surgical clips project in LEFT axilla. Additional punctate foreign bodies projecting at the thoracic and are likely external to the patient. IMPRESSION: Mild cardiomegaly and pulmonary vascular congestion. Minimal pleural thickening, less likely pleural effusions. Electronically Signed   By: Elon Alas M.D.   On: 04/09/2015 01:58     CBC  Recent Labs Lab 04/09/15 0100  WBC 14.8*  HGB 9.9*  HCT 30.7*  PLT 174  MCV 80.5  MCH 25.8*  MCHC 32.1  RDW 15.4*  LYMPHSABS 1.3  MONOABS 1.1*  EOSABS 0.1  BASOSABS 0.1    Chemistries   Recent Labs Lab 04/09/15 0100  NA 138  K 4.2  CL 103  CO2 32  GLUCOSE 155*  BUN 24*  CREATININE 1.04*  CALCIUM 8.9  AST 20  ALT 17  ALKPHOS 55  BILITOT 1.0   ------------------------------------------------------------------------------------------------------------------ estimated creatinine clearance is 45.5 mL/min (by C-G formula based on Cr of 1.04). ------------------------------------------------------------------------------------------------------------------ No results for input(s): HGBA1C in the last 72 hours. ------------------------------------------------------------------------------------------------------------------ No results for input(s): CHOL, HDL, LDLCALC, TRIG, CHOLHDL, LDLDIRECT in the last 72  hours. ------------------------------------------------------------------------------------------------------------------ No results for input(s): TSH, T4TOTAL, T3FREE, THYROIDAB in the last 72 hours.  Invalid input(s): FREET3 ------------------------------------------------------------------------------------------------------------------ No results for input(s): VITAMINB12, FOLATE, FERRITIN, TIBC, IRON, RETICCTPCT in the last 72 hours.  Coagulation profile  Recent Labs Lab 04/09/15 0100  INR 1.16    No results for input(s): DDIMER in the last 72 hours.  Cardiac Enzymes  Recent Labs Lab 04/09/15 0100  TROPONINI <0.03   ------------------------------------------------------------------------------------------------------------------ Invalid input(s): POCBNP    Assessment & Plan   A60 year old Caucasian female history of essential hypertension presenting with shortness of breath.  1.Sepsis, meeting septic criteria by temperature, leukocytosis present on arrival. Due to urinary tract infection, possible pneumonia: Continue IV vancomycin and Zosyn. Await urine cultures and blood cultures 2. Essential hypertension: Continue home medications included Catapres Cozaar 3. Acute respiratory failure: Due to acute congestive heart failure IV fluids have been stopped start IV Lasix and obtain echocardiogram of the heart 4. Type 2 diabetes non-insulin-requiring: Hold oral agents at insulin sliding scale 5. GERD without esophagitis: Continue Protonix     Code Status Orders        Start     Ordered   04/09/15 0248  Full code   Continuous     04/09/15 0248    Advance Directive Documentation        Most Recent Value   Type of Advance Directive  Healthcare Power of Attorney   Pre-existing out of facility DNR order (yellow form or pink MOST form)     "MOST" Form in Place?             Consults  none  DVT prophylaxis: Heparin  Lab Results  Component Value Date   PLT  174 04/09/2015     Time Spent in minutes   36min.   Dustin Flock M.D on 04/09/2015 at 1:04 PM  Between 7am to 6pm - Pager -  [phone]  After 6pm go to [email or web address] - password [email or web address] [facility]   Office  [phone]

## 2015-04-09 NOTE — ED Notes (Signed)
Pt arrived vie [facility], C/O difficulty breathing, 80% on room air. Currently 98% on 6L. Reports cough, congestion and weakness X3days

## 2015-04-09 NOTE — Plan of Care (Signed)
Problem: Safety: Goal: Ability to remain free from injury will improve Outcome: Progressing Pt remained free from injury during shift - pt will call for assistance and she was rounded on hourly.

## 2015-04-10 LAB — BASIC METABOLIC PANEL
Anion gap: 6 (ref 5–15)
BUN: 22 mg/dL — ABNORMAL HIGH (ref 6–20)
CHLORIDE: 97 mmol/L — AB (ref 101–111)
CO2: 33 mmol/L — ABNORMAL HIGH (ref 22–32)
CREATININE: 1.11 mg/dL — AB (ref 0.44–1.00)
Calcium: 8.7 mg/dL — ABNORMAL LOW (ref 8.9–10.3)
GFR calc non Af Amer: 44 mL/min — ABNORMAL LOW (ref >60)
GFR, EST AFRICAN AMERICAN: 51 mL/min — AB (ref >60)
Glucose, Bld: 144 mg/dL — ABNORMAL HIGH (ref 65–99)
Potassium: 3.9 mmol/L (ref 3.5–5.1)
SODIUM: 136 mmol/L (ref 135–145)

## 2015-04-10 LAB — CBC
HEMATOCRIT: 30.8 % — AB (ref 35.0–47.0)
HEMOGLOBIN: 9.8 g/dL — AB (ref 12.0–16.0)
MCH: 25.6 pg — AB (ref 26.0–34.0)
MCHC: 31.8 g/dL — AB (ref 32.0–36.0)
MCV: 80.5 fL (ref 80.0–100.0)
Platelets: 173 10*3/uL (ref 150–440)
RBC: 3.83 MIL/uL (ref 3.80–5.20)
RDW: 15.2 % — ABNORMAL HIGH (ref 11.5–14.5)
WBC: 10.1 10*3/uL (ref 3.6–11.0)

## 2015-04-10 LAB — GLUCOSE, CAPILLARY
GLUCOSE-CAPILLARY: 147 mg/dL — AB (ref 65–99)
GLUCOSE-CAPILLARY: 142 mg/dL — AB (ref 65–99)
GLUCOSE-CAPILLARY: 137 mg/dL — AB (ref 65–99)
Glucose-Capillary: 148 mg/dL — ABNORMAL HIGH (ref 65–99)

## 2015-04-10 MED ORDER — FUROSEMIDE 20 MG PO TABS
20.0000 mg | ORAL_TABLET | Freq: Two times a day (BID) | ORAL | Status: DC
  Administered 2015-04-10 – 2015-04-11 (×2): 20 mg via ORAL
  Filled 2015-04-10 (×2): qty 1

## 2015-04-10 NOTE — Plan of Care (Signed)
Problem: Physical Regulation: Goal: Will remain free from infection Outcome: Progressing Pt on IV vancomycin and zosyn. Pt WBC count 14. 8 yesterday. Am lab results pending   Problem: Skin Integrity: Goal: Risk for impaired skin integrity will decrease Outcome: Progressing Pt turned and repositioned to preserve skin integrity. Incontinent of urine.

## 2015-04-10 NOTE — Progress Notes (Signed)
[place] at [facility]                                                                                                                                                                                            Patient Demographics   Sarah Orozco, is a 81 y.o. female, DOB - Apr 28, 1928, LNL:[id number]  Admit date - 04/09/2015   Admitting Physician Lytle Butte, MD  Outpatient Primary MD for the patient is [facility], MIRIAM K, MD   LOS - 1  Subjective: Patient is little off confused still has cough and shortness of breath was seen by speech and diet changed     Review of Systems:   CONSTITUTIONAL: Limited due to patient having some confusion Complains of cough no chest pain    Vitals:   Filed Vitals:   04/09/15 1404 04/09/15 2059 04/10/15 0545 04/10/15 1334  BP: 124/45 124/83 136/47 126/48  Pulse: 58 65 71 79  Temp: 97.6 F (36.4 C) 98.6 F (37 C) 97.8 F (36.6 C) 97.8 F (36.6 C)  TempSrc: Oral Oral Oral Oral  Resp:      Height:      Weight:      SpO2: 94% 94% 94% 99%    Wt Readings from Last 3 Encounters:  04/09/15 100.154 kg (220 lb 12.8 oz)  10/02/14 103.562 kg (228 lb 5 oz)  07/03/14 93.441 kg (206 lb)     Intake/Output Summary (Last 24 hours) at 04/10/15 1349 Last data filed at 04/10/15 0935  Gross per 24 hour  Intake    662 ml  Output      0 ml  Net    662 ml    Physical Exam:   GENERAL: Pleasant-appearing in no apparent distress.  HEAD, EYES, EARS, NOSE AND THROAT: Atraumatic, normocephalic. Extraocular muscles are intact. Pupils equal and reactive to light. Sclerae anicteric. No conjunctival injection. No oro-pharyngeal erythema.  NECK: Supple. There is no jugular venous distention. No bruits, no lymphadenopathy, no thyromegaly.  HEART: Regular rate and rhythm,. No murmurs, no rubs, no clicks.  LUNGS: Lungs have bilateral rhonchi throughout both lungs, no wheezing ABDOMEN: Soft, flat, nontender, nondistended.  Has good bowel sounds. No hepatosplenomegaly appreciated.  EXTREMITIES: No evidence of any cyanosis, clubbing, or peripheral edema.  +2 pedal and radial pulses bilaterally.  NEUROLOGIC: The patient is alert, awake, and oriented x3 with no focal motor or sensory deficits appreciated bilaterally.  SKIN: Moist and warm with no rashes appreciated.  Psych: Not anxious, depressed LN: No inguinal LN enlargement    Antibiotics   Anti-infectives    Start  Dose/Rate Route Frequency Ordered Stop   04/09/15 0900  vancomycin (VANCOCIN) IVPB 1000 mg/200 mL premix     1,000 mg 200 mL/hr over 60 Minutes Intravenous Every 18 hours 04/09/15 0349     04/09/15 0600  piperacillin-tazobactam (ZOSYN) IVPB 3.375 g     3.375 g 12.5 mL/hr over 240 Minutes Intravenous 3 times per day 04/09/15 0439     04/09/15 0115  vancomycin (VANCOCIN) IVPB 1000 mg/200 mL premix     1,000 mg 200 mL/hr over 60 Minutes Intravenous  Once 04/09/15 0100 04/09/15 0245   04/09/15 0115  piperacillin-tazobactam (ZOSYN) IVPB 3.375 g  Status:  Discontinued     3.375 g 100 mL/hr over 30 Minutes Intravenous 3 times per day 04/09/15 0100 04/09/15 0438      Medications   Scheduled Meds: . calcium-vitamin D  1 tablet Oral BID  . furosemide  20 mg Oral BID  . heparin  5,000 Units Subcutaneous 3 times per day  . insulin aspart  0-5 Units Subcutaneous QHS  . insulin aspart  0-9 Units Subcutaneous TID WC  . losartan  100 mg Oral Daily  . meloxicam  7.5 mg Oral Daily  . miconazole  1 application Topical BID  . multivitamin-lutein  1 capsule Oral Daily  . pantoprazole  40 mg Oral Daily  . piperacillin-tazobactam (ZOSYN)  IV  3.375 g Intravenous 3 times per day  . pneumococcal 23 valent vaccine  0.5 mL Intramuscular Tomorrow-1000  . sertraline  150 mg Oral Daily  . sodium chloride  3 mL Intravenous Q12H  . vancomycin  1,000 mg Intravenous Q18H   Continuous Infusions:  PRN Meds:.acetaminophen **OR** acetaminophen, ALPRAZolam,  ipratropium-albuterol, morphine injection, ondansetron **OR** ondansetron (ZOFRAN) IV, oxyCODONE   Data Review:   Micro Results Recent Results (from the past 240 hour(s))  Blood Culture (routine x 2)     Status: None (Preliminary result)   Collection Time: 04/09/15  1:00 AM  Result Value Ref Range Status   Specimen Description BLOOD LEFT ARM  Final   Special Requests BOTTLES DRAWN AEROBIC AND ANAEROBIC 5CC  Final   Culture NO GROWTH 1 DAY  Final   Report Status PENDING  Incomplete  Blood Culture (routine x 2)     Status: None (Preliminary result)   Collection Time: 04/09/15  1:39 AM  Result Value Ref Range Status   Specimen Description BLOOD LEFT ASSIST CONTROL  Final   Special Requests BOTTLES DRAWN AEROBIC AND ANAEROBIC 3CC  Final   Culture  Setup Time   Final    GRAM POSITIVE COCCI IN CLUSTERS ANAEROBIC BOTTLE ONLY CRITICAL RESULT CALLED TO, READ BACK BY AND VERIFIED WITH: Earlie Server AT 8841 04/10/15 DV    Culture GRAM POSITIVE COCCI IDENTIFICATION TO FOLLOW   Final   Report Status PENDING  Incomplete  Urine culture     Status: None (Preliminary result)   Collection Time: 04/09/15  1:39 AM  Result Value Ref Range Status   Specimen Description URINE, CATHETERIZED  Final   Special Requests NONE  Final   Culture   Final    >=100,000 COLONIES/mL GRAM NEGATIVE RODS IDENTIFICATION AND SUSCEPTIBILITIES TO FOLLOW    Report Status PENDING  Incomplete  MRSA PCR Screening     Status: None   Collection Time: 04/09/15  5:33 AM  Result Value Ref Range Status   MRSA by PCR NEGATIVE NEGATIVE Final    Comment:        The GeneXpert MRSA Assay (FDA approved for NASAL  specimens only), is one component of a comprehensive MRSA colonization surveillance program. It is not intended to diagnose MRSA infection nor to guide or monitor treatment for MRSA infections.     Radiology Reports Dg Chest Port 1 View  04/09/2015  CLINICAL DATA:  Difficulty breathing, cough, and congestion  and weakness for a few days. History of diabetes, hypertension, hyperlipidemia, breast cancer, non-Hodgkin's lymphoma. EXAM: PORTABLE CHEST 1 VIEW COMPARISON:  Chest radiograph March 16, 2014 FINDINGS: The cardiac silhouette is mildly enlarged, unchanged. Calcified tortuous aorta most often seen with hypertension. Pulmonary vascular congestion. Slight blunting of costophrenic angles. No focal consolidation. No pneumothorax. Soft tissue planes and included osseous structures are nonsuspicious. Surgical clips project in LEFT axilla. Additional punctate foreign bodies projecting at the thoracic and are likely external to the patient. IMPRESSION: Mild cardiomegaly and pulmonary vascular congestion. Minimal pleural thickening, less likely pleural effusions. Electronically Signed   By: Elon Alas M.D.   On: 04/09/2015 01:58     CBC  Recent Labs Lab 04/09/15 0100 04/10/15 0604  WBC 14.8* 10.1  HGB 9.9* 9.8*  HCT 30.7* 30.8*  PLT 174 173  MCV 80.5 80.5  MCH 25.8* 25.6*  MCHC 32.1 31.8*  RDW 15.4* 15.2*  LYMPHSABS 1.3  --   MONOABS 1.1*  --   EOSABS 0.1  --   BASOSABS 0.1  --     Chemistries   Recent Labs Lab 04/09/15 0100 04/10/15 0604  NA 138 136  K 4.2 3.9  CL 103 97*  CO2 32 33*  GLUCOSE 155* 144*  BUN 24* 22*  CREATININE 1.04* 1.11*  CALCIUM 8.9 8.7*  AST 20  --   ALT 17  --   ALKPHOS 55  --   BILITOT 1.0  --    ------------------------------------------------------------------------------------------------------------------ estimated creatinine clearance is 42.6 mL/min (by C-G formula based on Cr of 1.11). ------------------------------------------------------------------------------------------------------------------ No results for input(s): HGBA1C in the last 72 hours. ------------------------------------------------------------------------------------------------------------------ No results for input(s): CHOL, HDL, LDLCALC, TRIG, CHOLHDL, LDLDIRECT in the  last 72 hours. ------------------------------------------------------------------------------------------------------------------ No results for input(s): TSH, T4TOTAL, T3FREE, THYROIDAB in the last 72 hours.  Invalid input(s): FREET3 ------------------------------------------------------------------------------------------------------------------ No results for input(s): VITAMINB12, FOLATE, FERRITIN, TIBC, IRON, RETICCTPCT in the last 72 hours.  Coagulation profile  Recent Labs Lab 04/09/15 0100  INR 1.16    No results for input(s): DDIMER in the last 72 hours.  Cardiac Enzymes  Recent Labs Lab 04/09/15 0100  TROPONINI <0.03   ------------------------------------------------------------------------------------------------------------------ Invalid input(s): POCBNP    Assessment & Plan   A16 year old Caucasian female history of essential hypertension presenting with shortness of breath.  1.Sepsis, meeting septic criteria by temperature, leukocytosis present on arrival. Due to urinary tract infection, possible pneumonia: Continue IV vancomycin and Zosyn. Await urine cultures . One blood culture is positive for gram-positive cocci in clusters await ID of these likely contaminant 2. Essential hypertension: Continue home medications included Catapres Cozaar 3. Acute respiratory failure: Due to pneumonia, possible acute diastolic CHF: Change IV Lasix to oral 4. Type 2 diabetes non-insulin-requiring: Sliding scale insulin 5. GERD without esophagitis: Continue Protonix     Code Status Orders        Start     Ordered   04/09/15 0248  Full code   Continuous     04/09/15 0248    Advance Directive Documentation        Most Recent Value   Type of Advance Directive  Healthcare Power of Attorney   Pre-existing out of facility DNR order (yellow  form or pink MOST form)     "MOST" Form in Place?             Consults  none  DVT prophylaxis: Heparin  Lab Results   Component Value Date   PLT 173 04/10/2015     Time Spent in minutes   57min.   Dustin Flock M.D on 04/10/2015 at 1:49 PM  Between 7am to 6pm - Pager - [phone]  After 6pm go to [email or web address] - password [email or web address] [facility]   Office  [phone]

## 2015-04-10 NOTE — Progress Notes (Signed)
Speech Language Pathology Treatment: Dysphagia  Patient Details Name: Sarah Orozco MRN: [id number] DOB: Oct 12, 1927 Today's Date: 04/10/2015 Time: 4917-9150 SLP Time Calculation (min) (ACUTE ONLY): 42 min  Assessment / Plan / Recommendation Clinical Impression  Pt appears to present w/ continued risk for aspiration but does appear to tolerate a Dys. 1 w/ Nectar liquids diet via tsp following strict aspiration precautions and use of strategy of f/u, dry swallow to completely clear oropharyngeally b/f taking another trial. Pt presents w/ decreased Cognitive awareness and often talks w/ food in her mouth and requires moderate cues for follow through w/ taking po's. Pt requires feeding assistance w/ all po's. Noted min. Increased respiratory effort w/ the increased work of taking po's - frequent rest breaks given to avoid SOB/WOB. Due to pt's increased risk for aspiration, rec. A Dys. 1 diet w/ Nectar liquids via tsp; strict aspiration precautions; meds in puree - crushed as able. ST will continue to f/u w/ toleration of diet and potential need for objective swallow study for further assessment. NSG udpated.    HPI Other Pertinent Information: Pt states she is getting coughed up and choked on food and liquid. HPI: Sarah Orozco is a 81 y.o. female with a known history of hyperlipidemia unspecified, type 2 diabetes non-insulin-requiring presenting with shortness of breath. She is a somewhat limited historian. Apparently she has had shortness of breath for 4 days. She has associated cough productive as well as subjective fevers and chills. Given the above symptoms he is brought to the Hospital further workup and evaluation. Upon arrival she was noted to be hypoxic with oxygen saturations in the low 80s on room air she is subsequently placed on supplemental O2 with improvement of her oxygen saturations. Sepsis protocol was initiated given white count and temperature she received vancomycin/Zosyn for broad  coverage.   Pertinent Vitals Pain Assessment: 0-10 Pain Score: 10-Worst pain ever Pain Location: "all over" Pain Descriptors / Indicators: Aching ("my arthritis") Pain Intervention(s): Repositioned (NSG giving pt's meds)  SLP Plan  Continue with current plan of care    Recommendations Diet recommendations: Dysphagia 1 (puree);Nectar-thick liquid Liquids provided via: Teaspoon Medication Administration: Crushed with puree Supervision: Staff to assist with self feeding;Full supervision/cueing for compensatory strategies;Trained caregiver to feed patient Compensations: Minimize environmental distractions;Slow rate;Small sips/bites;Multiple dry swallows after each bite/sip;Follow solids with liquid Postural Changes and/or Swallow Maneuvers: Seated upright 90 degrees              General recommendations: Rehab consult Oral Care Recommendations: Oral care BID;Staff/trained caregiver to provide oral care Follow up Recommendations: Skilled Nursing facility Plan: Continue with current plan of care    [place], [place], CCC-SLP  Sarah Orozco 04/10/2015, 3:45 PM

## 2015-04-10 NOTE — Progress Notes (Signed)
Pt became upset this am. Large bruise to right hand after phlebotomist drew am labs. Pink sleeve for arm precautions placed. Pt informed that anticoagulants can cause her to bruise more easily. After that pt refused her am subcutaneous heparin injection.

## 2015-04-10 NOTE — Plan of Care (Signed)
Although pt answers orientation questions appropriately, she is more confused and is concerned that she will lose her room at [facility].  Severe congested cough - Pt had + blood culture for anaerobic bottle gram + cocci.  Getting vanc and zosyn for possible PNA and UTI.  Had modified swallow test and diet was changed.  Pt c/o generalized pain but doesn't want to take anything stronger than tylenol for pain.

## 2015-04-11 ENCOUNTER — Inpatient Hospital Stay: Payer: Medicare Other

## 2015-04-11 DIAGNOSIS — A419 Sepsis, unspecified organism: Secondary | ICD-10-CM | POA: Diagnosis not present

## 2015-04-11 LAB — URINE CULTURE

## 2015-04-11 LAB — GLUCOSE, CAPILLARY
GLUCOSE-CAPILLARY: 211 mg/dL — AB (ref 65–99)
Glucose-Capillary: 153 mg/dL — ABNORMAL HIGH (ref 65–99)
Glucose-Capillary: 167 mg/dL — ABNORMAL HIGH (ref 65–99)
Glucose-Capillary: 144 mg/dL — ABNORMAL HIGH (ref 65–99)

## 2015-04-11 LAB — CBC
HEMATOCRIT: 32.3 % — AB (ref 35.0–47.0)
HEMOGLOBIN: 10.5 g/dL — AB (ref 12.0–16.0)
MCH: 26.3 pg (ref 26.0–34.0)
MCHC: 32.4 g/dL (ref 32.0–36.0)
MCV: 81.2 fL (ref 80.0–100.0)
Platelets: 192 10*3/uL (ref 150–440)
RBC: 3.97 MIL/uL (ref 3.80–5.20)
RDW: 15.1 % — AB (ref 11.5–14.5)
WBC: 8.9 10*3/uL (ref 3.6–11.0)

## 2015-04-11 LAB — BASIC METABOLIC PANEL
ANION GAP: 8 (ref 5–15)
BUN: 18 mg/dL (ref 6–20)
CALCIUM: 9 mg/dL (ref 8.9–10.3)
CHLORIDE: 96 mmol/L — AB (ref 101–111)
CO2: 38 mmol/L — AB (ref 22–32)
Creatinine, Ser: 1.09 mg/dL — ABNORMAL HIGH (ref 0.44–1.00)
GFR calc non Af Amer: 45 mL/min — ABNORMAL LOW (ref >60)
GFR, EST AFRICAN AMERICAN: 52 mL/min — AB (ref >60)
GLUCOSE: 153 mg/dL — AB (ref 65–99)
POTASSIUM: 4.1 mmol/L (ref 3.5–5.1)
Sodium: 142 mmol/L (ref 135–145)

## 2015-04-11 MED ORDER — FUROSEMIDE 10 MG/ML IJ SOLN
40.0000 mg | Freq: Two times a day (BID) | INTRAMUSCULAR | Status: DC
Start: 2015-04-11 — End: 2015-04-12
  Administered 2015-04-11: 40 mg via INTRAVENOUS
  Filled 2015-04-11: qty 4

## 2015-04-11 MED ORDER — ACETYLCYSTEINE 20 % IN SOLN
4.0000 mL | Freq: Two times a day (BID) | RESPIRATORY_TRACT | Status: DC
  Administered 2015-04-12 – 2015-04-14 (×4): 4 mL via RESPIRATORY_TRACT
  Filled 2015-04-11 (×4): qty 4

## 2015-04-11 MED ORDER — CHLORHEXIDINE GLUCONATE 0.12 % MT SOLN
15.0000 mL | Freq: Two times a day (BID) | OROMUCOSAL | Status: DC
  Administered 2015-04-11: 23:00:00 15 mL via OROMUCOSAL

## 2015-04-11 MED ORDER — SODIUM CHLORIDE 0.9 % IV SOLN
1.5000 g | Freq: Four times a day (QID) | INTRAVENOUS | Status: DC
  Administered 2015-04-11 – 2015-04-16 (×20): 1.5 g via INTRAVENOUS
  Filled 2015-04-11 (×22): qty 1.5

## 2015-04-11 NOTE — Progress Notes (Signed)
PT Cancellation Note  Patient Details Name: Sarah Orozco MRN: [id number] DOB: 1927/10/16   Cancelled Treatment:    Reason Eval/Treat Not Completed: Fatigue/lethargy limiting ability to participate (Evaluation re-attempted.  Per RN, patient with intermittent agitation this AM, just received xanax to decrease agitation.  Patient sleeping soundly and unable to participate with mobility assessment at this time.  Will re-attempt at later time/date.)   Chayson Charters H. Owens Shark, PT, DPT, NCS 04/11/2015, 11:01 AM [phone]

## 2015-04-11 NOTE — Plan of Care (Signed)
Problem: Nutrition: Goal: Adequate nutrition will be maintained Outcome: Progressing Pt with complaints of overall generalized discomfort. Given Tylenol with relief. Pt declined any medication for pain that was stronger. Nectar thick liquids given by spoon. Pt still with congested wet cough. Incontinent of urine. Turned and repositioned in bed when patient would allow.

## 2015-04-11 NOTE — Progress Notes (Signed)
Speech Language Pathology Treatment: Dysphagia  Patient Details Name: Sarah Orozco MRN: [id number] DOB: Jan 27, 1928 Today's Date: 04/11/2015 Time: 1200-1240 SLP Time Calculation (min) (ACUTE ONLY): 40 min  Assessment / Plan / Recommendation Clinical Impression  Pt appears to present w/ continued risk for aspiration but does appear to tolerate a Dys. 1 w/ Nectar liquids diet via tsp and small, single cup sip following strict aspiration precautions and use of strategies of lingual sweeping and f/u, dry swallow to completely clear oropharyngeally b/f taking another trial. Pt presents w/ decreased Cognitive awareness and often talks w/ food in her mouth and requires moderate cues for follow through w/ taking po's. Pt requires feeding assistance w/ all po's but attempted to feed self initially during the meal. Noted min. Increased respiratory effort w/ the increased work of taking po's - frequent rest breaks given to avoid SOB/WOB. Due to pt's increased risk for aspiration, rec. A Dys. 1 diet w/ Nectar liquids via tsp; strict aspiration precautions; meds in puree - crushed as able. ST will continue to f/u w/ toleration of diet and potential need for objective swallow study for further assessment; MD has ordered a f/u CXR. NSG udpated   HPI Other Pertinent Information: Pt continues to have intermittent, decreased Cognitive attention/awareness to her environment. She states she gets "bound up" in her chest; she continues to exhibit a cngested cough intermittently w/ and w/out po's. Pt is able to help feed herself w/ staff support. Due to continued concern for aspiration, MD consulted and agreed w/ a repeat CXR to assess lung status. HPI: Sarah Orozco is a 81 y.o. female with a known history of hyperlipidemia unspecified, type 2 diabetes non-insulin-requiring presenting with shortness of breath. She is a somewhat limited historian. Apparently she has had shortness of breath for 4 days. She has associated cough  productive as well as subjective fevers and chills. Given the above symptoms he is brought to the Hospital further workup and evaluation. Upon arrival she was noted to be hypoxic with oxygen saturations in the low 80s on room air she is subsequently placed on supplemental O2 with improvement of her oxygen saturations. Sepsis protocol was initiated given white count and temperature she received vancomycin/Zosyn for broad coverage.   Pertinent Vitals Pain Assessment: 0-10 Pain Score: 4  Pain Location: all over Pain Descriptors / Indicators: Aching Pain Intervention(s):  (NSG informed)  SLP Plan  Continue with current plan of care    Recommendations Diet recommendations: Dysphagia 1 (puree);Nectar-thick liquid Liquids provided via: Teaspoon;Cup Medication Administration: Crushed with puree Supervision: Staff to assist with self feeding;Full supervision/cueing for compensatory strategies;Trained caregiver to feed patient Compensations: Minimize environmental distractions;Slow rate;Small sips/bites;Multiple dry swallows after each bite/sip;Follow solids with liquid Postural Changes and/or Swallow Maneuvers: Seated upright 90 degrees              General recommendations: Rehab consult Oral Care Recommendations: Oral care BID;Staff/trained caregiver to provide oral care Follow up Recommendations: Skilled Nursing facility Plan: Continue with current plan of care    Sarah Tree, MS, CCC-SLP  Sarah Orozco,Sarah Orozco 04/11/2015, 1:16 PM

## 2015-04-11 NOTE — Clinical Social Work Note (Signed)
Clinical Social Work Assessment  Patient Details  Name: Sarah Orozco MRN: [id number] Date of Birth: July 03, 1927  Date of referral:  04/11/15               Reason for consult:  Facility Placement                Permission sought to share information with:  Facility Sport and exercise psychologist, Other (POA) Permission granted to share information::  Yes, Verbal Permission Granted  Name::      Sarah Orozco ([facility]) and Sarah Orozco (POA))  Housing/Transportation Living arrangements for the past 2 months:  [place] of Information:  Patient Patient Interpreter Needed:  None Criminal Activity/Legal Involvement Pertinent to Current Situation/Hospitalization:  No - Comment as needed Significant Relationships:  Other(Comment) (POA (Sarah Orozco) and [facility] staff) Lives with:  Facility Resident Do you feel safe going back to the place where you live?  Yes Need for family participation in patient care:  Yes (Comment)  Care giving concerns:  Pt is resident of [facility], however may need SNF for STR at discharge.   Social Worker assessment / plan:  Clinical Social Worker was consulted as pt was admitted from a facility. CSW spoke with pt's RN and RNCM to staff pt. CSW met with pt to address consult. Per RN, pt has received a Xanax. Pt woke up when name was called, however was sleepy. CSW introduced herself and explained role of social work. Pt is alert and oriented to self, situation, and place. Pt confirmed that she was from [facility] and gave verbal permission to speak with Sarah Orozco, the administrator at facility. Pt reported that she has no children and never married. Per pt, she does not have family involvement and is on her own. Pt's also reported that her POA was Sarah Orozco. Pt stated that Sarah Orozco has had a heart attack and does not know the status of her.  Per RNCM, pt has declined PT. CSW addressed this with pt. Pt shared that she does not feel well  enough to work with PT. CSW stressed the importance of working with PT as pt has been in hospital bed for a couple of days and the discharge recommendations.   CSW called and left a message with [facility] requesting a return phone call.   CSW will continue to follow for discharge planning needs.   Employment status:  Retired Forensic scientist:  Medicare PT Recommendations:  Not assessed at this time (Pt has refused PT) Information / Referral to community resources:  Other (Comment Required) (Will provide information if needed and/or requested. )  Patient/Family's Response to care:  Pt stated that she did not want to work with PT as she was tired. CSW encouraged to work with PT this afternoon.   Patient/Family's Understanding of and Emotional Response to Diagnosis, Current Treatment, and Prognosis:  Pt shared that she has been sick for quite some time. Pt reported working with PT at ALF. Pt anticipates that she is able to return.   Emotional Assessment Appearance:  Appears stated age Attitude/Demeanor/Rapport:  Apprehensive Affect (typically observed):  Appropriate Orientation:  Oriented to Self, Oriented to Place, Oriented to Situation Alcohol / Substance use:  Never Used Psych involvement (Current and /or in the community):  No (Comment)  Discharge Needs  Concerns to be addressed:  Patient refuses services, Adjustment to Illness Readmission within the last 30 days:    Current discharge risk:  Lack of support system, Other (  Per report from [facility], pt has declined PT eval, which is needed to assess with appropriate disposition. ) Barriers to Discharge:  Other (Addressing barriers. )   Sarah Dates, LCSW 04/11/2015, 11:34 AM

## 2015-04-11 NOTE — Care Management Important Message (Signed)
Important Message  Patient Details  Name: Sarah Orozco MRN: [id number] Date of Birth: [birth year]   Medicare Important Message Given:  Yes-third notification given    Shelbie Ammons, RN 04/11/2015, 8:12 AM

## 2015-04-11 NOTE — Progress Notes (Signed)
ANTIBIOTIC CONSULT NOTE - INITIAL  Pharmacy Consult for ampicillin/sulbactam Indication: Aspiration pneumonia  Allergies  Allergen Reactions  . Adhesive [Tape] Other (See Comments)    Nursing home records...unknown side effect  . Percocet [Oxycodone-Acetaminophen] Other (See Comments)    From nursing home records, unknown side effect    Patient Measurements: Height: 5\' 4"  (162.6 cm) Weight: 220 lb 12.8 oz (100.154 kg) IBW/kg (Calculated) : 54.7  Vital Signs: Temp: 97.5 F (36.4 C) (11/09 0511) Temp Source: Oral (11/09 0511) BP: 120/68 mmHg (11/09 0511) Pulse Rate: 87 (11/09 0937) Intake/Output from previous day: 11/08 0701 - 11/09 0700 In: 243 [P.O.:240; I.V.:3] Out: -  Intake/Output from this shift:    Labs:  Recent Labs  04/09/15 0100 04/10/15 0604 04/11/15 0522  WBC 14.8* 10.1 8.9  HGB 9.9* 9.8* 10.5*  PLT 174 173 192  CREATININE 1.04* 1.11* 1.09*   Estimated Creatinine Clearance: 43.4 mL/min (by C-G formula based on Cr of 1.09). No results for input(s): VANCOTROUGH, VANCOPEAK, VANCORANDOM, GENTTROUGH, GENTPEAK, GENTRANDOM, TOBRATROUGH, TOBRAPEAK, TOBRARND, AMIKACINPEAK, AMIKACINTROU, AMIKACIN in the last 72 hours.   Microbiology: Recent Results (from the past 720 hour(s))  Blood Culture (routine x 2)     Status: None (Preliminary result)   Collection Time: 04/09/15  1:00 AM  Result Value Ref Range Status   Specimen Description BLOOD LEFT ARM  Final   Special Requests BOTTLES DRAWN AEROBIC AND ANAEROBIC 5CC  Final   Culture NO GROWTH 1 DAY  Final   Report Status PENDING  Incomplete  Blood Culture (routine x 2)     Status: None (Preliminary result)   Collection Time: 04/09/15  1:39 AM  Result Value Ref Range Status   Specimen Description BLOOD LEFT ASSIST CONTROL  Final   Special Requests BOTTLES DRAWN AEROBIC AND ANAEROBIC 3CC  Final   Culture  Setup Time   Final    GRAM POSITIVE COCCI IN CLUSTERS ANAEROBIC BOTTLE ONLY CRITICAL RESULT CALLED TO, READ  BACK BY AND VERIFIED WITH: Earlie Server AT 3244 04/10/15 DV    Culture   Final    COAGULASE NEGATIVE STAPHYLOCOCCUS Results consistent with contamination.    Report Status PENDING  Incomplete  Urine culture     Status: None   Collection Time: 04/09/15  1:39 AM  Result Value Ref Range Status   Specimen Description URINE, CATHETERIZED  Final   Special Requests NONE  Final   Culture >=100,000 COLONIES/mL ESCHERICHIA COLI  Final   Report Status 04/11/2015 FINAL  Final   Organism ID, Bacteria ESCHERICHIA COLI  Final      Susceptibility   Escherichia coli - MIC*    AMPICILLIN 4 SENSITIVE Sensitive     CEFTAZIDIME <=1 SENSITIVE Sensitive     CEFAZOLIN <=4 SENSITIVE Sensitive     CEFTRIAXONE <=1 SENSITIVE Sensitive     CIPROFLOXACIN <=0.25 SENSITIVE Sensitive     GENTAMICIN <=1 SENSITIVE Sensitive     IMIPENEM <=0.25 SENSITIVE Sensitive     TRIMETH/SULFA <=20 SENSITIVE Sensitive     PIP/TAZO Value in next row Sensitive      SENSITIVE<=4    * >=100,000 COLONIES/mL ESCHERICHIA COLI  MRSA PCR Screening     Status: None   Collection Time: 04/09/15  5:33 AM  Result Value Ref Range Status   MRSA by PCR NEGATIVE NEGATIVE Final    Comment:        The GeneXpert MRSA Assay (FDA approved for NASAL specimens only), is one component of a comprehensive MRSA colonization surveillance program. It  is not intended to diagnose MRSA infection nor to guide or monitor treatment for MRSA infections.     Medical History: Past Medical History  Diagnosis Date  . Mitral valve insufficiency   . Diabetes mellitus without complication ([place])   . Sleep apnea   . Hyperlipidemia   . Hypertension   . History of breast cancer   . Anemia   . Urinary incontinence   . Osteoarthritis   . GERD (gastroesophageal reflux disease)   . History of uterine cancer   . Non-Hodgkin's lymphoma ([place])   . Incontinence     Medications:  Anti-infectives    Start     Dose/Rate Route Frequency Ordered Stop    04/11/15 1500  ampicillin-sulbactam (UNASYN) 1.5 g in sodium chloride 0.9 % 50 mL IVPB     1.5 g 100 mL/hr over 30 Minutes Intravenous Every 6 hours 04/11/15 1325     04/09/15 0900  vancomycin (VANCOCIN) IVPB 1000 mg/200 mL premix  Status:  Discontinued     1,000 mg 200 mL/hr over 60 Minutes Intravenous Every 18 hours 04/09/15 0349 04/11/15 1302   04/09/15 0600  piperacillin-tazobactam (ZOSYN) IVPB 3.375 g  Status:  Discontinued     3.375 g 12.5 mL/hr over 240 Minutes Intravenous 3 times per day 04/09/15 0439 04/11/15 1303   04/09/15 0115  vancomycin (VANCOCIN) IVPB 1000 mg/200 mL premix     1,000 mg 200 mL/hr over 60 Minutes Intravenous  Once 04/09/15 0100 04/09/15 0245   04/09/15 0115  piperacillin-tazobactam (ZOSYN) IVPB 3.375 g  Status:  Discontinued     3.375 g 100 mL/hr over 30 Minutes Intravenous 3 times per day 04/09/15 0100 04/09/15 0438     Assessment: Pharmacy was dosing vancomycin and piperacillin/tazobactam day #52 in this 81 year old female for possible UTI/PNA as well as a positive blood culture. Blood culture resulted as coagulase negative staph x1 bottle which was determined to be a contaminant. Urine culture resulted as >100,000 E.coli pan-sensitive.   Vancomycin and piperacillin/tazobactam were discontinued today, however patient is aspirating so we have been consulted to dose ampicillin/sulbactam for aspiration pneumonia.   Plan:  Start ampicillin/sulbactam 1.5 g IV q6h based on indication and renal function. Pharmacy will continue to follow, thank you for the consult.   Darylene Price Iya Hamed 04/11/2015,1:25 PM

## 2015-04-11 NOTE — NC FL2 (Signed)
[place] LEVEL OF CARE SCREENING TOOL     IDENTIFICATION  Patient Name: Sarah Orozco Birthdate: 05-04-1928 Sex: female Admission Date (Current Location): 04/09/2015  [facility] and [place] Number: Educational psychologist and Address:  [facility], [address], [place], [place] [address]      Provider Number: [id number]  Attending Physician Name and Address:  Dustin Flock, MD  Relative Name and Phone Number:       Current Level of Care: Hospital Recommended Level of Care: [place] Prior Approval Number:    Date Approved/Denied:   PASRR Number:    Discharge Plan: SNF    Current Diagnoses: Patient Active Problem List   Diagnosis Date Noted  . Sepsis ([place]) 04/09/2015  . SOB (shortness of breath) 07/03/2014  . Essential hypertension 07/03/2014  . Morbid obesity ([place]) 07/03/2014  . Poorly controlled type 2 diabetes mellitus ([place]) 07/03/2014  . Incontinence 07/03/2014  . Obstructive sleep apnea 07/03/2014  . Chronic diastolic CHF (congestive heart failure) ([place]) 07/03/2014    Orientation ACTIVITIES/SOCIAL BLADDER RESPIRATION    Self, Time, Situation, Place  Family supportive Incontinent O2 (As needed)  BEHAVIORAL SYMPTOMS/MOOD NEUROLOGICAL BOWEL NUTRITION STATUS      Incontinent Diet (DY1 Nectar thick)  PHYSICIAN VISITS COMMUNICATION OF NEEDS Height & Weight Skin  30 days Verbally   220 lbs. Normal          AMBULATORY STATUS RESPIRATION    Assist extensive O2 (As needed)      Personal Care Assistance Level of Assistance  Feeding, Bathing, Dressing Bathing Assistance: Limited assistance Feeding assistance: Limited assistance Dressing Assistance: Limited assistance      Functional Limitations Info                SPECIAL CARE FACTORS FREQUENCY                      Additional Factors Info  Allergies, Psychotropic, Insulin Sliding Scale   Allergies Info: Adhesive, Perocet   Insulin  Sliding Scale Info: 3       Current Medications (04/11/2015): Current Facility-Administered Medications  Medication Dose Route Frequency Provider Last Rate Last Dose  . acetaminophen (TYLENOL) tablet 650 mg  650 mg Oral Q6H PRN Lytle Butte, MD   650 mg at 04/11/15 2156   Or  . acetaminophen (TYLENOL) suppository 650 mg  650 mg Rectal Q6H PRN Lytle Butte, MD      . acetylcysteine (MUCOMYST) 20 % nebulizer / oral solution 4 mL  4 mL Nebulization BID Dustin Flock, MD   4 mL at 04/11/15 1400  . ALPRAZolam Duanne Moron) tablet 0.25 mg  0.25 mg Oral BID PRN Lytle Butte, MD   0.25 mg at 04/11/15 2156  . ampicillin-sulbactam (UNASYN) 1.5 g in sodium chloride 0.9 % 50 mL IVPB  1.5 g Intravenous Q6H Lenis Noon, RPH   1.5 g at 04/11/15 2156  . calcium-vitamin D (OSCAL WITH D) 500-200 MG-UNIT per tablet 1 tablet  1 tablet Oral BID Lytle Butte, MD   1 tablet at 04/11/15 2156  . chlorhexidine (PERIDEX) 0.12 % solution 15 mL  15 mL Mouth Rinse BID Dustin Flock, MD   15 mL at 04/11/15 2319  . furosemide (LASIX) injection 40 mg  40 mg Intravenous Q12H Dustin Flock, MD   40 mg at 04/11/15 1355  . heparin injection 5,000 Units  5,000 Units Subcutaneous 3 times per day Lytle Butte, MD  5,000 Units at 04/11/15 2157  . insulin aspart (novoLOG) injection 0-5 Units  0-5 Units Subcutaneous QHS Lytle Butte, MD   2 Units at 04/11/15 2157  . insulin aspart (novoLOG) injection 0-9 Units  0-9 Units Subcutaneous TID WC Lytle Butte, MD   1 Units at 04/11/15 1709  . ipratropium-albuterol (DUONEB) 0.5-2.5 (3) MG/3ML nebulizer solution 3 mL  3 mL Nebulization Q4H PRN Lytle Butte, MD      . losartan (COZAAR) tablet 100 mg  100 mg Oral Daily Lytle Butte, MD   100 mg at 04/11/15 0902  . meloxicam (MOBIC) tablet 7.5 mg  7.5 mg Oral Daily Lytle Butte, MD   7.5 mg at 04/11/15 0911  . miconazole (MICOTIN) 2 % cream 1 application  1 application Topical BID Lytle Butte, MD   1 application at 05/69/79 2158  .  morphine 2 MG/ML injection 2 mg  2 mg Intravenous Q4H PRN Lytle Butte, MD      . multivitamin-lutein [facility]) capsule 1 capsule  1 capsule Oral Daily Lytle Butte, MD   1 capsule at 04/11/15 0902  . ondansetron (ZOFRAN) tablet 4 mg  4 mg Oral Q6H PRN Lytle Butte, MD       Or  . ondansetron [facility]) injection 4 mg  4 mg Intravenous Q6H PRN Lytle Butte, MD      . oxyCODONE (Oxy IR/ROXICODONE) immediate release tablet 5 mg  5 mg Oral Q4H PRN Lytle Butte, MD   5 mg at 04/10/15 1244  . pantoprazole (PROTONIX) EC tablet 40 mg  40 mg Oral Daily Lytle Butte, MD   40 mg at 04/11/15 0902  . sertraline (ZOLOFT) tablet 150 mg  150 mg Oral Daily Dustin Flock, MD   150 mg at 04/11/15 0902  . sodium chloride 0.9 % injection 3 mL  3 mL Intravenous Q12H Lytle Butte, MD   3 mL at 04/11/15 2158   Do not use this list as official medication orders. Please verify with discharge summary.  Discharge Medications:   Medication List    ASK your doctor about these medications        acetaminophen 325 MG tablet  Commonly known as:  TYLENOL  Take 325 mg by mouth every 6 (six) hours as needed.     ALPRAZolam 0.25 MG tablet  Commonly known as:  XANAX  Take 0.25 mg by mouth 2 (two) times daily as needed for anxiety.     amoxicillin 500 MG capsule  Commonly known as:  AMOXIL  Take 4 tablets 1 hour prior to dental procedures.     CALCIUM 600+D 600-400 MG-UNIT tablet  Generic drug:  Calcium Carbonate-Vitamin D  Take 1 tablet by mouth 2 (two) times daily.     cloNIDine 0.1 MG tablet  Commonly known as:  CATAPRES  Take 1 tablet (0.1 mg total) by mouth 2 (two) times daily.     glipiZIDE 5 MG tablet  Commonly known as:  GLUCOTROL  Take 2.5 mg by mouth daily before breakfast.     hydrochlorothiazide 25 MG tablet  Commonly known as:  HYDRODIURIL  Take 1 tablet (25 mg total) by mouth daily.     losartan 50 MG tablet  Commonly known as:  COZAAR  Take 2 tablets (100 mg total) by mouth  daily.     meloxicam 7.5 MG tablet  Commonly known as:  MOBIC  Take 7.5 mg by mouth daily.  multivitamin-lutein Caps capsule  Take 1 capsule by mouth daily.     nystatin 100000 UNIT/GM Powd  Apply 1-5 g topically 3 (three) times daily.     omeprazole 20 MG capsule  Commonly known as:  PRILOSEC  Take 20 mg by mouth daily.     sertraline 100 MG tablet  Commonly known as:  ZOLOFT  Take 150 mg by mouth daily.     T.E.D. BELOW KNEE/S-REGULAR Misc  by Does not apply route.        Relevant Imaging Results:  Relevant Lab Results:  Recent Labs    Additional Lillie, LCSW

## 2015-04-11 NOTE — Progress Notes (Signed)
PT Cancellation Note  Patient Details Name: Sarah Orozco MRN: [id number] DOB: [birth year]   Cancelled Treatment:    Reason Eval/Treat Not Completed: Patient declined, no reason specified.  Pt reporting she had too much pain "all over" and at one point briefly crying and then adamant on no PT today.  Will re-attempt PT at a later date/time as medically appropriate.   Raquel Sarna Aidian Salomon 04/11/2015, 10:21 AM Leitha Bleak, [place]

## 2015-04-11 NOTE — Progress Notes (Signed)
[place] at [facility]                                                                                                                                                                                            Patient Demographics   Sarah Orozco, is a 81 y.o. female, DOB - 28-Jan-1928, [id number]  Admit date - 04/09/2015   Admitting Physician Lytle Butte, MD  Outpatient Primary MD for the patient is [facility], MIRIAM K, MD   LOS - 2  Subjective: Continues to have shortness of breath and coughing.   Review of Systems:   CONSTITUTIONAL: Limited due to patient having some confusion Complains of cough no chest pain    Vitals:   Filed Vitals:   04/10/15 2151 04/10/15 2243 04/11/15 0511 04/11/15 0937  BP: 152/57  120/68   Pulse: 64 63 74 87  Temp: 98.3 F (36.8 C)  97.5 F (36.4 C)   TempSrc: Oral  Oral   Resp: 20  20   Height:      Weight:      SpO2: 99%  95%     Wt Readings from Last 3 Encounters:  04/09/15 100.154 kg (220 lb 12.8 oz)  10/02/14 103.562 kg (228 lb 5 oz)  07/03/14 93.441 kg (206 lb)     Intake/Output Summary (Last 24 hours) at 04/11/15 1354 Last data filed at 04/10/15 1830  Gross per 24 hour  Intake    240 ml  Output      0 ml  Net    240 ml    Physical Exam:   GENERAL: Pleasant-appearing in no apparent distress.  HEAD, EYES, EARS, NOSE AND THROAT: Atraumatic, normocephalic. Extraocular muscles are intact. Pupils equal and reactive to light. Sclerae anicteric. No conjunctival injection. No oro-pharyngeal erythema.  NECK: Supple. There is no jugular venous distention. No bruits, no lymphadenopathy, no thyromegaly.  HEART: Regular rate and rhythm,. No murmurs, no rubs, no clicks.  LUNGS: Lungs have bilateral rhonchi throughout both lungs, no wheezing ABDOMEN: Soft, flat, nontender, nondistended. Has good bowel sounds. No hepatosplenomegaly appreciated.  EXTREMITIES: No evidence of any cyanosis, clubbing,  or peripheral edema.  +2 pedal and radial pulses bilaterally.  NEUROLOGIC: The patient is alert, awake, and oriented x3 with no focal motor or sensory deficits appreciated bilaterally.  SKIN: Moist and warm with no rashes appreciated.  Psych: Not anxious, depressed LN: No inguinal LN enlargement    Antibiotics   Anti-infectives    Start     Dose/Rate Route Frequency Ordered Stop   04/11/15 1500  ampicillin-sulbactam (UNASYN) 1.5 g in sodium chloride  0.9 % 50 mL IVPB     1.5 g 100 mL/hr over 30 Minutes Intravenous Every 6 hours 04/11/15 1325     04/09/15 0900  vancomycin (VANCOCIN) IVPB 1000 mg/200 mL premix  Status:  Discontinued     1,000 mg 200 mL/hr over 60 Minutes Intravenous Every 18 hours 04/09/15 0349 04/11/15 1302   04/09/15 0600  piperacillin-tazobactam (ZOSYN) IVPB 3.375 g  Status:  Discontinued     3.375 g 12.5 mL/hr over 240 Minutes Intravenous 3 times per day 04/09/15 0439 04/11/15 1303   04/09/15 0115  vancomycin (VANCOCIN) IVPB 1000 mg/200 mL premix     1,000 mg 200 mL/hr over 60 Minutes Intravenous  Once 04/09/15 0100 04/09/15 0245   04/09/15 0115  piperacillin-tazobactam (ZOSYN) IVPB 3.375 g  Status:  Discontinued     3.375 g 100 mL/hr over 30 Minutes Intravenous 3 times per day 04/09/15 0100 04/09/15 0438      Medications   Scheduled Meds: . ampicillin-sulbactam (UNASYN) IV  1.5 g Intravenous Q6H  . calcium-vitamin D  1 tablet Oral BID  . furosemide  40 mg Intravenous Q12H  . heparin  5,000 Units Subcutaneous 3 times per day  . insulin aspart  0-5 Units Subcutaneous QHS  . insulin aspart  0-9 Units Subcutaneous TID WC  . losartan  100 mg Oral Daily  . meloxicam  7.5 mg Oral Daily  . miconazole  1 application Topical BID  . multivitamin-lutein  1 capsule Oral Daily  . pantoprazole  40 mg Oral Daily  . sertraline  150 mg Oral Daily  . sodium chloride  3 mL Intravenous Q12H   Continuous Infusions:  PRN Meds:.acetaminophen **OR** acetaminophen, ALPRAZolam,  ipratropium-albuterol, morphine injection, ondansetron **OR** ondansetron (ZOFRAN) IV, oxyCODONE   Data Review:   Micro Results Recent Results (from the past 240 hour(s))  Blood Culture (routine x 2)     Status: None (Preliminary result)   Collection Time: 04/09/15  1:00 AM  Result Value Ref Range Status   Specimen Description BLOOD LEFT ARM  Final   Special Requests BOTTLES DRAWN AEROBIC AND ANAEROBIC 5CC  Final   Culture NO GROWTH 1 DAY  Final   Report Status PENDING  Incomplete  Blood Culture (routine x 2)     Status: None (Preliminary result)   Collection Time: 04/09/15  1:39 AM  Result Value Ref Range Status   Specimen Description BLOOD LEFT ASSIST CONTROL  Final   Special Requests BOTTLES DRAWN AEROBIC AND ANAEROBIC 3CC  Final   Culture  Setup Time   Final    GRAM POSITIVE COCCI IN CLUSTERS ANAEROBIC BOTTLE ONLY CRITICAL RESULT CALLED TO, READ BACK BY AND VERIFIED WITH: Earlie Server AT 8938 04/10/15 DV    Culture   Final    COAGULASE NEGATIVE STAPHYLOCOCCUS Results consistent with contamination.    Report Status PENDING  Incomplete  Urine culture     Status: None   Collection Time: 04/09/15  1:39 AM  Result Value Ref Range Status   Specimen Description URINE, CATHETERIZED  Final   Special Requests NONE  Final   Culture >=100,000 COLONIES/mL ESCHERICHIA COLI  Final   Report Status 04/11/2015 FINAL  Final   Organism ID, Bacteria ESCHERICHIA COLI  Final      Susceptibility   Escherichia coli - MIC*    AMPICILLIN 4 SENSITIVE Sensitive     CEFTAZIDIME <=1 SENSITIVE Sensitive     CEFAZOLIN <=4 SENSITIVE Sensitive     CEFTRIAXONE <=1 SENSITIVE Sensitive  CIPROFLOXACIN <=0.25 SENSITIVE Sensitive     GENTAMICIN <=1 SENSITIVE Sensitive     IMIPENEM <=0.25 SENSITIVE Sensitive     TRIMETH/SULFA <=20 SENSITIVE Sensitive     PIP/TAZO Value in next row Sensitive      SENSITIVE<=4    * >=100,000 COLONIES/mL ESCHERICHIA COLI  MRSA PCR Screening     Status: None    Collection Time: 04/09/15  5:33 AM  Result Value Ref Range Status   MRSA by PCR NEGATIVE NEGATIVE Final    Comment:        The GeneXpert MRSA Assay (FDA approved for NASAL specimens only), is one component of a comprehensive MRSA colonization surveillance program. It is not intended to diagnose MRSA infection nor to guide or monitor treatment for MRSA infections.     Radiology Reports Dg Chest 1 View  04/11/2015  CLINICAL DATA:  Increasing shortness of breath, personal history of non hodgkins lymphoma, uterine and breast cancer EXAM: CHEST 1 VIEW COMPARISON:  04/09/15 FINDINGS: Stable mild cardiac enlargement with vascular calcification of the aorta and aortic uncoiling. Mild central vascular congestion. Mild bibasilar atelectasis. No evidence of consolidation or edema. IMPRESSION: No acute findings. Stable cardiac enlargement and vascular congestion. Electronically Signed   By: Skipper Cliche M.D.   On: 04/11/2015 11:48   Dg Chest Port 1 View  04/09/2015  CLINICAL DATA:  Difficulty breathing, cough, and congestion and weakness for a few days. History of diabetes, hypertension, hyperlipidemia, breast cancer, non-Hodgkin's lymphoma. EXAM: PORTABLE CHEST 1 VIEW COMPARISON:  Chest radiograph March 16, 2014 FINDINGS: The cardiac silhouette is mildly enlarged, unchanged. Calcified tortuous aorta most often seen with hypertension. Pulmonary vascular congestion. Slight blunting of costophrenic angles. No focal consolidation. No pneumothorax. Soft tissue planes and included osseous structures are nonsuspicious. Surgical clips project in LEFT axilla. Additional punctate foreign bodies projecting at the thoracic and are likely external to the patient. IMPRESSION: Mild cardiomegaly and pulmonary vascular congestion. Minimal pleural thickening, less likely pleural effusions. Electronically Signed   By: Elon Alas M.D.   On: 04/09/2015 01:58     CBC  Recent Labs Lab 04/09/15 0100  04/10/15 0604 04/11/15 0522  WBC 14.8* 10.1 8.9  HGB 9.9* 9.8* 10.5*  HCT 30.7* 30.8* 32.3*  PLT 174 173 192  MCV 80.5 80.5 81.2  MCH 25.8* 25.6* 26.3  MCHC 32.1 31.8* 32.4  RDW 15.4* 15.2* 15.1*  LYMPHSABS 1.3  --   --   MONOABS 1.1*  --   --   EOSABS 0.1  --   --   BASOSABS 0.1  --   --     Chemistries   Recent Labs Lab 04/09/15 0100 04/10/15 0604 04/11/15 0522  NA 138 136 142  K 4.2 3.9 4.1  CL 103 97* 96*  CO2 32 33* 38*  GLUCOSE 155* 144* 153*  BUN 24* 22* 18  CREATININE 1.04* 1.11* 1.09*  CALCIUM 8.9 8.7* 9.0  AST 20  --   --   ALT 17  --   --   ALKPHOS 55  --   --   BILITOT 1.0  --   --    ------------------------------------------------------------------------------------------------------------------ estimated creatinine clearance is 43.4 mL/min (by C-G formula based on Cr of 1.09). ------------------------------------------------------------------------------------------------------------------ No results for input(s): HGBA1C in the last 72 hours. ------------------------------------------------------------------------------------------------------------------ No results for input(s): CHOL, HDL, LDLCALC, TRIG, CHOLHDL, LDLDIRECT in the last 72 hours. ------------------------------------------------------------------------------------------------------------------ No results for input(s): TSH, T4TOTAL, T3FREE, THYROIDAB in the last 72 hours.  Invalid input(s): FREET3 ------------------------------------------------------------------------------------------------------------------ No  results for input(s): VITAMINB12, FOLATE, FERRITIN, TIBC, IRON, RETICCTPCT in the last 72 hours.  Coagulation profile  Recent Labs Lab 04/09/15 0100  INR 1.16    No results for input(s): DDIMER in the last 72 hours.  Cardiac Enzymes  Recent Labs Lab 04/09/15 0100  TROPONINI <0.03    ------------------------------------------------------------------------------------------------------------------ Invalid input(s): POCBNP    Assessment & Plan   A62 year old Caucasian female history of essential hypertension presenting with shortness of breath.  1.Sepsis, meeting septic criteria by temperature, leukocytosis present on arrival. Due to urinary tract infection, possible pneumonia: Due to concern for aspiration changed to Unasyn 2. Essential hypertension: Continue home medications included Catapres Cozaar 3. Acute respiratory failure: Due to pneumonia, possible acute diastolic CHF: Change IV Lasix to oral 4. Type 2 diabetes non-insulin-requiring: Sliding scale insulin 5. GERD without esophagitis: Continue Protonix 6. Acute respiratory failure suspect due to aspiration pneumonia, I have given her IV Lasix for a few days without any improvement however due to failure to improve I will go ahead and restart IV Lasix to see if that helps again  CODE STATUS is full, I have discussed with the patient and her healthcare power of attorney regarding very poor prognosis and consideration of DO NOT RESUSCITATE and they're still discussing this further.     Code Status Orders        Start     Ordered   04/09/15 0248  Full code   Continuous     04/09/15 0248    Advance Directive Documentation        Most Recent Value   Type of Advance Directive  Healthcare Power of Attorney   Pre-existing out of facility DNR order (yellow form or pink MOST form)     "MOST" Form in Place?             Consults  none  DVT prophylaxis: Heparin  Lab Results  Component Value Date   PLT 192 04/11/2015     Time Spent in minutes   59min.   Dustin Flock M.D on 04/11/2015 at 1:54 PM  Between 7am to 6pm - Pager - [phone]  After 6pm go to [email or web address] - password [email or web address] [facility]   Office  [phone]

## 2015-04-11 NOTE — Plan of Care (Addendum)
Problem: Nutrition: Goal: Adequate nutrition will be maintained Outcome: Progressing Plan of care progress to goal for: 1. Pain-pt c/o generalized all over pain, prn tylenol given with improvement, pt resting comfortably in bed 2. Hemodynamically-             -VSS, afebrile, IV ABT per orders 3. Diet-pt tolerating diet 4. Activity-high falls, pt is total care Pt refusing all neb treatments, and refused PT.

## 2015-04-11 NOTE — Progress Notes (Signed)
PT Cancellation Note  Patient Details Name: Sarah Orozco MRN: [id number] DOB: [birth year]   Cancelled Treatment:    Reason Eval/Treat Not Completed: Other (comment) (Evaulation re-attempted in PM.  Patient adamantly refusing attempts at evaluation despite max encouragement; progressively agitated with continued efforts.  Reinforced role of PT with regards to discharge planning and patient goal to return to ALF.  Patient continues refusal.  Will continue efforts next date.)  Brylin Stopper H. Owens Shark, PT, DPT, NCS 04/11/2015, 2:20 PM [phone]

## 2015-04-12 ENCOUNTER — Inpatient Hospital Stay: Payer: Medicare Other

## 2015-04-12 ENCOUNTER — Encounter: Payer: Self-pay | Admitting: Radiology

## 2015-04-12 LAB — CBC
HEMATOCRIT: 33.3 % — AB (ref 35.0–47.0)
Hemoglobin: 10.5 g/dL — ABNORMAL LOW (ref 12.0–16.0)
MCH: 25.7 pg — AB (ref 26.0–34.0)
MCHC: 31.6 g/dL — ABNORMAL LOW (ref 32.0–36.0)
MCV: 81.2 fL (ref 80.0–100.0)
Platelets: 207 10*3/uL (ref 150–440)
RBC: 4.11 MIL/uL (ref 3.80–5.20)
RDW: 14.9 % — ABNORMAL HIGH (ref 11.5–14.5)
WBC: 10.1 10*3/uL (ref 3.6–11.0)

## 2015-04-12 LAB — GLUCOSE, CAPILLARY
GLUCOSE-CAPILLARY: 170 mg/dL — AB (ref 65–99)
GLUCOSE-CAPILLARY: 172 mg/dL — AB (ref 65–99)
Glucose-Capillary: 171 mg/dL — ABNORMAL HIGH (ref 65–99)
Glucose-Capillary: 303 mg/dL — ABNORMAL HIGH (ref 65–99)

## 2015-04-12 LAB — BASIC METABOLIC PANEL
ANION GAP: 9 (ref 5–15)
BUN: 17 mg/dL (ref 6–20)
CHLORIDE: 92 mmol/L — AB (ref 101–111)
CO2: 39 mmol/L — AB (ref 22–32)
Calcium: 8.9 mg/dL (ref 8.9–10.3)
Creatinine, Ser: 0.91 mg/dL (ref 0.44–1.00)
GFR calc Af Amer: >60 mL/min (ref >60)
GFR calc non Af Amer: 56 mL/min — ABNORMAL LOW (ref >60)
GLUCOSE: 161 mg/dL — AB (ref 65–99)
POTASSIUM: 3.7 mmol/L (ref 3.5–5.1)
Sodium: 140 mmol/L (ref 135–145)

## 2015-04-12 MED ORDER — FUROSEMIDE 10 MG/ML IJ SOLN
40.0000 mg | Freq: Two times a day (BID) | INTRAMUSCULAR | Status: DC
  Administered 2015-04-12 – 2015-04-13 (×3): 40 mg via INTRAVENOUS
  Filled 2015-04-12 (×3): qty 4

## 2015-04-12 MED ORDER — IOHEXOL 300 MG/ML  SOLN
75.0000 mL | Freq: Once | INTRAMUSCULAR | Status: AC | PRN
  Administered 2015-04-12: 75 mL via INTRAVENOUS

## 2015-04-12 MED ORDER — METHYLPREDNISOLONE SODIUM SUCC 125 MG IJ SOLR
60.0000 mg | Freq: Four times a day (QID) | INTRAMUSCULAR | Status: DC
  Administered 2015-04-12 – 2015-04-14 (×9): 60 mg via INTRAVENOUS
  Filled 2015-04-12 (×9): qty 2

## 2015-04-12 MED ORDER — IPRATROPIUM-ALBUTEROL 0.5-2.5 (3) MG/3ML IN SOLN
3.0000 mL | Freq: Four times a day (QID) | RESPIRATORY_TRACT | Status: DC
  Administered 2015-04-12 – 2015-04-15 (×12): 3 mL via RESPIRATORY_TRACT
  Filled 2015-04-12 (×13): qty 3

## 2015-04-12 MED ORDER — CETYLPYRIDINIUM CHLORIDE 0.05 % MT LIQD
7.0000 mL | Freq: Two times a day (BID) | OROMUCOSAL | Status: DC
  Administered 2015-04-12 – 2015-04-16 (×9): 7 mL via OROMUCOSAL

## 2015-04-12 NOTE — Progress Notes (Signed)
Initial Nutrition Assessment      INTERVENTION:  Meals and snacks: Cater to pt preferences Medical Nutrition Supplement Therapy: Will add mightyshake BID for added nutrition (nectar thick appropriate)   NUTRITION DIAGNOSIS:   Inadequate oral intake related to acute illness as evidenced by per patient/family report.    GOAL:   Patient will meet greater than or equal to 90% of their needs    MONITOR:    (Energy intake, Digestive system)  REASON FOR ASSESSMENT:    (diet order)    ASSESSMENT:      Pt admitted with sepsis, UTI, possible pneumonia  Past Medical History  Diagnosis Date  . Mitral valve insufficiency   . Diabetes mellitus without complication ([place])   . Sleep apnea   . Hyperlipidemia   . Hypertension   . History of breast cancer   . Anemia   . Urinary incontinence   . Osteoarthritis   . GERD (gastroesophageal reflux disease)   . History of uterine cancer   . Non-Hodgkin's lymphoma ([place])   . Incontinence     Current Nutrition: ate few bites at breakfast, 40%, 60%, sips and bites recorded during admission (day 4)  Food/Nutrition-Related History: Pt reports fairly good appetite prior to admission   Scheduled Medications:  . acetylcysteine  4 mL Nebulization BID  . ampicillin-sulbactam (UNASYN) IV  1.5 g Intravenous Q6H  . antiseptic oral rinse  7 mL Mouth Rinse q12n4p  . calcium-vitamin D  1 tablet Oral BID  . furosemide  40 mg Intravenous BID  . heparin  5,000 Units Subcutaneous 3 times per day  . insulin aspart  0-5 Units Subcutaneous QHS  . insulin aspart  0-9 Units Subcutaneous TID WC  . losartan  100 mg Oral Daily  . meloxicam  7.5 mg Oral Daily  . miconazole  1 application Topical BID  . multivitamin-lutein  1 capsule Oral Daily  . pantoprazole  40 mg Oral Daily  . sertraline  150 mg Oral Daily  . sodium chloride  3 mL Intravenous Q12H         Electrolyte/Renal Profile and Glucose Profile:   Recent Labs Lab 04/10/15 0604  04/11/15 0522 04/12/15 0650  NA 136 142 140  K 3.9 4.1 3.7  CL 97* 96* 92*  CO2 33* 38* 39*  BUN 22* 18 17  CREATININE 1.11* 1.09* 0.91  CALCIUM 8.7* 9.0 8.9  GLUCOSE 144* 153* 161*   Protein Profile:  Recent Labs Lab 04/09/15 0100  ALBUMIN 3.1*    Gastrointestinal Profile: Last BM: 11/7   Nutrition-Focused Physical Exam Findings: Nutrition-Focused physical exam completed. Findings are WDL for fat depletion, muscle depletion, and edema.     Weight Change: noted 3.5% weight loss in the last 6 months    Diet Order:  DIET - DYS 1 Room service appropriate?: Yes with Assist; Fluid consistency:: Nectar Thick  Skin:   reviewed   Height:   Ht Readings from Last 1 Encounters:  04/09/15 5\' 4"  (1.626 m)    Weight:   Wt Readings from Last 1 Encounters:  04/09/15 220 lb 12.8 oz (100.154 kg)     BMI:  Body mass index is 37.88 kg/(m^2).  Estimated Nutritional Needs:   Kcal:  BEE 980 kcals (IF 1.1-1.3, AF 1.2) [id number] kcals/d. Using IBW of 55kg  Protein:  (1.1-1.3 g/kg) 61-72 g/d  Fluid:  (25-70ml/kg) 1375-1650ml/d  EDUCATION NEEDS:   No education needs identified at this time  [place]. Sarah Orozco, RD, LDN  [phone] (pager)

## 2015-04-12 NOTE — Progress Notes (Signed)
PT Cancellation Note  Patient Details Name: RYLI DEPETRO MRN: [id number] DOB: 07/26/1927   Cancelled Treatment:    Reason Eval/Treat Not Completed: Medical issues which prohibited therapy (Chart reviewed for re-attempt at evaluation.  Patient currently pending chest CT due to continued respiratory decline.  Will hold at this time and re-attempt as medically appropriate once results received.)   Zelina Jimerson H. Owens Shark, PT, DPT, NCS 04/12/2015, 2:26 PM [phone]

## 2015-04-12 NOTE — Clinical Social Work Note (Signed)
Clinical Social Worker spoke with pt's POA, David Stall [phone]). Per pt's [name], she would like pt moved to SNF for LTC at [facility]. CSW offered to do a bed search for LTC beds at other facilities, which was declined. Pt has refused all PT eval attempts during this admission. Pt works with PT at [facility] through [facility] and will not walk with anyone else, per Ms. McKinney.   On 04/11/2015, CSW also spoke with Horris Latino at [facility]. Pt is transported via wheelchair. Facility is able to accept pt at discharge.   CSW referred pt to [facility] for LTC. Per admissions coordinator, pt is on the waiting list for LTC. Pt does not qualify for STR as she has declined to work with PT while admitted.   CSW will update pt's POA. CSW will continue to follow.   Darden Dates, MSW, LCSW Clinical Social Worker  [phone]

## 2015-04-12 NOTE — Progress Notes (Signed)
Patient case d/w health care power attorney Milda Smart, she wishes patient to be DNR, no intubation and no chest compression.

## 2015-04-12 NOTE — Progress Notes (Signed)
[place] at [facility]                                                                                                                                                                                            Patient Demographics   Sarah Orozco, is a 81 y.o. female, DOB - Oct 05, 1927, [id number]  Admit date - 04/09/2015   Admitting Physician Lytle Butte, MD  Outpatient Primary MD for the patient is MCLAUGHLIN, MIRIAM K, MD   LOS - 3  Subjective: pt on 4l oxygen , still sounds congested, not feeling any better her healthcare power of attorney is at bedside     Review of Systems:   CONSTITUTIONAL: Limited due to patient having some confusion Complains of cough no chest pain    Vitals:   Filed Vitals:   04/12/15 0424 04/12/15 0828 04/12/15 0834 04/12/15 1333  BP: 150/59  143/56 139/53  Pulse: 91 86 86 88  Temp: 98.9 F (37.2 C)  98.1 F (36.7 C) 98.2 F (36.8 C)  TempSrc: Oral  Oral Oral  Resp:   20   Height:      Weight:      SpO2: 91%  93% 91%    Wt Readings from Last 3 Encounters:  04/09/15 100.154 kg (220 lb 12.8 oz)  10/02/14 103.562 kg (228 lb 5 oz)  07/03/14 93.441 kg (206 lb)     Intake/Output Summary (Last 24 hours) at 04/12/15 1410 Last data filed at 04/12/15 0128  Gross per 24 hour  Intake    120 ml  Output    700 ml  Net   -580 ml    Physical Exam:   GENERAL: Pleasant-appearing in no apparent distress.  HEAD, EYES, EARS, NOSE AND THROAT: Atraumatic, normocephalic. Extraocular muscles are intact. Pupils equal and reactive to light. Sclerae anicteric. No conjunctival injection. No oro-pharyngeal erythema.  NECK: Supple. There is no jugular venous distention. No bruits, no lymphadenopathy, no thyromegaly.  HEART: Regular rate and rhythm,. No murmurs, no rubs, no clicks.  LUNGS: Lungs have bilateral rhonchi throughout both lungs, positive wheezing ABDOMEN: Soft, flat, nontender, nondistended. Has good bowel  sounds. No hepatosplenomegaly appreciated.  EXTREMITIES: No evidence of any cyanosis, clubbing, or peripheral edema.  +2 pedal and radial pulses bilaterally.  NEUROLOGIC: The patient is alert, awake, and oriented x3 with no focal motor or sensory deficits appreciated bilaterally.  SKIN: Moist and warm with no rashes appreciated.  Psych: Not anxious, depressed LN: No inguinal LN enlargement    Antibiotics   Anti-infectives    Start     Dose/Rate Route Frequency  Ordered Stop   04/11/15 1500  ampicillin-sulbactam (UNASYN) 1.5 g in sodium chloride 0.9 % 50 mL IVPB     1.5 g 100 mL/hr over 30 Minutes Intravenous Every 6 hours 04/11/15 1325     04/09/15 0900  vancomycin (VANCOCIN) IVPB 1000 mg/200 mL premix  Status:  Discontinued     1,000 mg 200 mL/hr over 60 Minutes Intravenous Every 18 hours 04/09/15 0349 04/11/15 1302   04/09/15 0600  piperacillin-tazobactam (ZOSYN) IVPB 3.375 g  Status:  Discontinued     3.375 g 12.5 mL/hr over 240 Minutes Intravenous 3 times per day 04/09/15 0439 04/11/15 1303   04/09/15 0115  vancomycin (VANCOCIN) IVPB 1000 mg/200 mL premix     1,000 mg 200 mL/hr over 60 Minutes Intravenous  Once 04/09/15 0100 04/09/15 0245   04/09/15 0115  piperacillin-tazobactam (ZOSYN) IVPB 3.375 g  Status:  Discontinued     3.375 g 100 mL/hr over 30 Minutes Intravenous 3 times per day 04/09/15 0100 04/09/15 0438      Medications   Scheduled Meds: . acetylcysteine  4 mL Nebulization BID  . ampicillin-sulbactam (UNASYN) IV  1.5 g Intravenous Q6H  . antiseptic oral rinse  7 mL Mouth Rinse q12n4p  . calcium-vitamin D  1 tablet Oral BID  . furosemide  40 mg Intravenous BID  . heparin  5,000 Units Subcutaneous 3 times per day  . insulin aspart  0-5 Units Subcutaneous QHS  . insulin aspart  0-9 Units Subcutaneous TID WC  . ipratropium-albuterol  3 mL Nebulization Q6H  . losartan  100 mg Oral Daily  . meloxicam  7.5 mg Oral Daily  . methylPREDNISolone (SOLU-MEDROL) injection   60 mg Intravenous Q6H  . miconazole  1 application Topical BID  . multivitamin-lutein  1 capsule Oral Daily  . pantoprazole  40 mg Oral Daily  . sertraline  150 mg Oral Daily  . sodium chloride  3 mL Intravenous Q12H   Continuous Infusions:  PRN Meds:.acetaminophen **OR** acetaminophen, ALPRAZolam, morphine injection, ondansetron **OR** ondansetron (ZOFRAN) IV, oxyCODONE   Data Review:   Micro Results Recent Results (from the past 240 hour(s))  Blood Culture (routine x 2)     Status: None (Preliminary result)   Collection Time: 04/09/15  1:00 AM  Result Value Ref Range Status   Specimen Description BLOOD LEFT ARM  Final   Special Requests BOTTLES DRAWN AEROBIC AND ANAEROBIC 5CC  Final   Culture NO GROWTH 1 DAY  Final   Report Status PENDING  Incomplete  Blood Culture (routine x 2)     Status: None (Preliminary result)   Collection Time: 04/09/15  1:39 AM  Result Value Ref Range Status   Specimen Description BLOOD LEFT ASSIST CONTROL  Final   Special Requests BOTTLES DRAWN AEROBIC AND ANAEROBIC 3CC  Final   Culture  Setup Time   Final    GRAM POSITIVE COCCI IN CLUSTERS ANAEROBIC BOTTLE ONLY CRITICAL RESULT CALLED TO, READ BACK BY AND VERIFIED WITH: Earlie Server AT [id number] 04/10/15 DV    Culture   Final    COAGULASE NEGATIVE STAPHYLOCOCCUS Results consistent with contamination.    Report Status PENDING  Incomplete  Urine culture     Status: None   Collection Time: 04/09/15  1:39 AM  Result Value Ref Range Status   Specimen Description URINE, CATHETERIZED  Final   Special Requests NONE  Final   Culture >=100,000 COLONIES/mL ESCHERICHIA COLI  Final   Report Status 04/11/2015 FINAL  Final   Organism ID,  Bacteria ESCHERICHIA COLI  Final      Susceptibility   Escherichia coli - MIC*    AMPICILLIN 4 SENSITIVE Sensitive     CEFTAZIDIME <=1 SENSITIVE Sensitive     CEFAZOLIN <=4 SENSITIVE Sensitive     CEFTRIAXONE <=1 SENSITIVE Sensitive     CIPROFLOXACIN <=0.25 SENSITIVE  Sensitive     GENTAMICIN <=1 SENSITIVE Sensitive     IMIPENEM <=0.25 SENSITIVE Sensitive     TRIMETH/SULFA <=20 SENSITIVE Sensitive     PIP/TAZO Value in next row Sensitive      SENSITIVE<=4    * >=100,000 COLONIES/mL ESCHERICHIA COLI  MRSA PCR Screening     Status: None   Collection Time: 04/09/15  5:33 AM  Result Value Ref Range Status   MRSA by PCR NEGATIVE NEGATIVE Final    Comment:        The GeneXpert MRSA Assay (FDA approved for NASAL specimens only), is one component of a comprehensive MRSA colonization surveillance program. It is not intended to diagnose MRSA infection nor to guide or monitor treatment for MRSA infections.     Radiology Reports Dg Chest 1 View  04/11/2015  CLINICAL DATA:  Increasing shortness of breath, personal history of non hodgkins lymphoma, uterine and breast cancer EXAM: CHEST 1 VIEW COMPARISON:  04/09/15 FINDINGS: Stable mild cardiac enlargement with vascular calcification of the aorta and aortic uncoiling. Mild central vascular congestion. Mild bibasilar atelectasis. No evidence of consolidation or edema. IMPRESSION: No acute findings. Stable cardiac enlargement and vascular congestion. Electronically Signed   By: Skipper Cliche M.D.   On: 04/11/2015 11:48   Dg Chest Port 1 View  04/09/2015  CLINICAL DATA:  Difficulty breathing, cough, and congestion and weakness for a few days. History of diabetes, hypertension, hyperlipidemia, breast cancer, non-Hodgkin's lymphoma. EXAM: PORTABLE CHEST 1 VIEW COMPARISON:  Chest radiograph March 16, 2014 FINDINGS: The cardiac silhouette is mildly enlarged, unchanged. Calcified tortuous aorta most often seen with hypertension. Pulmonary vascular congestion. Slight blunting of costophrenic angles. No focal consolidation. No pneumothorax. Soft tissue planes and included osseous structures are nonsuspicious. Surgical clips project in LEFT axilla. Additional punctate foreign bodies projecting at the thoracic and are likely  external to the patient. IMPRESSION: Mild cardiomegaly and pulmonary vascular congestion. Minimal pleural thickening, less likely pleural effusions. Electronically Signed   By: Elon Alas M.D.   On: 04/09/2015 01:58     CBC  Recent Labs Lab 04/09/15 0100 04/10/15 0604 04/11/15 0522 04/12/15 0650  WBC 14.8* 10.1 8.9 10.1  HGB 9.9* 9.8* 10.5* 10.5*  HCT 30.7* 30.8* 32.3* 33.3*  PLT 174 173 192 207  MCV 80.5 80.5 81.2 81.2  MCH 25.8* 25.6* 26.3 25.7*  MCHC 32.1 31.8* 32.4 31.6*  RDW 15.4* 15.2* 15.1* 14.9*  LYMPHSABS 1.3  --   --   --   MONOABS 1.1*  --   --   --   EOSABS 0.1  --   --   --   BASOSABS 0.1  --   --   --     Chemistries   Recent Labs Lab 04/09/15 0100 04/10/15 0604 04/11/15 0522 04/12/15 0650  NA 138 136 142 140  K 4.2 3.9 4.1 3.7  CL 103 97* 96* 92*  CO2 32 33* 38* 39*  GLUCOSE 155* 144* 153* 161*  BUN 24* 22* 18 17  CREATININE 1.04* 1.11* 1.09* 0.91  CALCIUM 8.9 8.7* 9.0 8.9  AST 20  --   --   --   ALT 17  --   --   --  ALKPHOS 55  --   --   --   BILITOT 1.0  --   --   --    ------------------------------------------------------------------------------------------------------------------ estimated creatinine clearance is 52 mL/min (by C-G formula based on Cr of 0.91). ------------------------------------------------------------------------------------------------------------------ No results for input(s): HGBA1C in the last 72 hours. ------------------------------------------------------------------------------------------------------------------ No results for input(s): CHOL, HDL, LDLCALC, TRIG, CHOLHDL, LDLDIRECT in the last 72 hours. ------------------------------------------------------------------------------------------------------------------ No results for input(s): TSH, T4TOTAL, T3FREE, THYROIDAB in the last 72 hours.  Invalid input(s):  FREET3 ------------------------------------------------------------------------------------------------------------------ No results for input(s): VITAMINB12, FOLATE, FERRITIN, TIBC, IRON, RETICCTPCT in the last 72 hours.  Coagulation profile  Recent Labs Lab 04/09/15 0100  INR 1.16    No results for input(s): DDIMER in the last 72 hours.  Cardiac Enzymes  Recent Labs Lab 04/09/15 0100  TROPONINI <0.03   ------------------------------------------------------------------------------------------------------------------ Invalid input(s): POCBNP    Assessment & Plan   A42 year old Caucasian female history of essential hypertension presenting with shortness of breath.  1.Sepsis, meeting septic criteria by temperature, leukocytosis present on arrival. Due to urinary tract infection, possible pneumonia: Due to concern for aspiration on Unasyn 2. Essential hypertension: Continue home medications included Catapres Cozaar 3. Acute respiratory failure: Due to pneumonia, possible acute diastolic CHF: No significant improvement despite IV Lasix therapy, I will obtain CT scan of the chest to further evaluate her lungs since her chest x-rays just showed mild vascular congestion, due to wheezing I will also add Solu-Medrol to her current regimen change nebs to schedule 4. Type 2 diabetes non-insulin-requiring: Sliding scale insulin blood sugars elevated at low dose Lantus 5. GERD without esophagitis: Continue Protonix 6. Acute respiratory failure suspect due to aspiration pneumonia, I have given her IV Lasix for a few days without any improvement however due to failure to improve I will go ahead and restart IV Lasix to see if that helps again  CODE STATUS discussed again with healthcare power of attorney she is agreeable to DO NOT RESUSCITATE, prognosis poor in this patient    Code Status Orders        Start     Ordered   04/09/15 0248  Full code   Continuous     04/09/15 0248     Advance Directive Documentation        Most Recent Value   Type of Advance Directive  Healthcare Power of Attorney   Pre-existing out of facility DNR order (yellow form or pink MOST form)     "MOST" Form in Place?             Consults  none  DVT prophylaxis: Heparin  Lab Results  Component Value Date   PLT 207 04/12/2015     Time Spent in minutes   55min.   Dustin Flock M.D on 04/12/2015 at 2:10 PM  Between 7am to 6pm - Pager - [phone]  After 6pm go to [email or web address] - password [email or web address] [place] [facility]   Office  [phone]

## 2015-04-12 NOTE — Clinical Social Work Note (Signed)
Clinical Social Worker updated pt's POA. CSW initiated bed search for [facility] for a LTC SNF. CSW will follow up with bed offers. CSW also updated MD. CSW will continue to follow.   Darden Dates, MSW, LCSW Clinical Social Worker [phone]

## 2015-04-13 LAB — CULTURE, BLOOD (ROUTINE X 2)

## 2015-04-13 LAB — GLUCOSE, CAPILLARY
GLUCOSE-CAPILLARY: 285 mg/dL — AB (ref 65–99)
GLUCOSE-CAPILLARY: 246 mg/dL — AB (ref 65–99)
GLUCOSE-CAPILLARY: 276 mg/dL — AB (ref 65–99)
Glucose-Capillary: 217 mg/dL — ABNORMAL HIGH (ref 65–99)

## 2015-04-13 MED ORDER — INSULIN GLARGINE 100 UNIT/ML ~~LOC~~ SOLN
10.0000 [IU] | Freq: Every day | SUBCUTANEOUS | Status: DC
  Administered 2015-04-13 – 2015-04-15 (×3): 10 [IU] via SUBCUTANEOUS
  Filled 2015-04-13 (×3): qty 0.1

## 2015-04-13 NOTE — Progress Notes (Signed)
[place] at [facility]                                                                                                                                                                                            Patient Demographics   Sarah Orozco, is a 81 y.o. female, DOB - 01-Apr-1928, [id number]  Admit date - 04/09/2015   Admitting Physician Lytle Butte, MD  Outpatient Primary MD for the patient is [facility], Wilhemena Durie, MD   LOS - 4  Subjective: Patient had a CT scan of the chest which showed multifocal pneumonia,  confused   Review of Systems:   CONSTITUTIONAL: Limited due to patient having some confusion Complains of cough no chest pain    Vitals:   Filed Vitals:   04/12/15 1951 04/12/15 2051 04/13/15 0607 04/13/15 0729  BP:  141/60 174/65   Pulse:  92 83   Temp:  98.4 F (36.9 C) 97.7 F (36.5 C)   TempSrc:  Oral Axillary   Resp:  20 20   Height:      Weight:      SpO2: 92% 89% 91% 90%    Wt Readings from Last 3 Encounters:  04/09/15 100.154 kg (220 lb 12.8 oz)  10/02/14 103.562 kg (228 lb 5 oz)  07/03/14 93.441 kg (206 lb)     Intake/Output Summary (Last 24 hours) at 04/13/15 1310 Last data filed at 04/13/15 1003  Gross per 24 hour  Intake    220 ml  Output   1350 ml  Net  -1130 ml    Physical Exam:   GENERAL: Pleasant-appearing in mild respiratory distress HEAD, EYES, EARS, NOSE AND THROAT: Atraumatic, normocephalic. Extraocular muscles are intact. Pupils equal and reactive to light. Sclerae anicteric. No conjunctival injection. No oro-pharyngeal erythema.  NECK: Supple. There is no jugular venous distention. No bruits, no lymphadenopathy, no thyromegaly.  HEART: Regular rate and rhythm,. No murmurs, no rubs, no clicks.  LUNGS: Lungs have bilateral rhonchi throughout both lungs, positive wheezing ABDOMEN: Soft, flat, nontender, nondistended. Has good bowel sounds. No hepatosplenomegaly appreciated.   EXTREMITIES: No evidence of any cyanosis, clubbing, or peripheral edema.  +2 pedal and radial pulses bilaterally.  NEUROLOGIC:  Confused no focal deficits SKIN: Moist and warm with no rashes appreciated.  Psych: Not anxious, depressed LN: No inguinal LN enlargement    Antibiotics   Anti-infectives    Start     Dose/Rate Route Frequency Ordered Stop   04/11/15 1500  ampicillin-sulbactam (UNASYN) 1.5 g in sodium chloride 0.9 % 50 mL IVPB     1.5 g 100 mL/hr  over 30 Minutes Intravenous Every 6 hours 04/11/15 1325     04/09/15 0900  vancomycin (VANCOCIN) IVPB 1000 mg/200 mL premix  Status:  Discontinued     1,000 mg 200 mL/hr over 60 Minutes Intravenous Every 18 hours 04/09/15 0349 04/11/15 1302   04/09/15 0600  piperacillin-tazobactam (ZOSYN) IVPB 3.375 g  Status:  Discontinued     3.375 g 12.5 mL/hr over 240 Minutes Intravenous 3 times per day 04/09/15 0439 04/11/15 1303   04/09/15 0115  vancomycin (VANCOCIN) IVPB 1000 mg/200 mL premix     1,000 mg 200 mL/hr over 60 Minutes Intravenous  Once 04/09/15 0100 04/09/15 0245   04/09/15 0115  piperacillin-tazobactam (ZOSYN) IVPB 3.375 g  Status:  Discontinued     3.375 g 100 mL/hr over 30 Minutes Intravenous 3 times per day 04/09/15 0100 04/09/15 0438      Medications   Scheduled Meds: . acetylcysteine  4 mL Nebulization BID  . ampicillin-sulbactam (UNASYN) IV  1.5 g Intravenous Q6H  . antiseptic oral rinse  7 mL Mouth Rinse q12n4p  . calcium-vitamin D  1 tablet Oral BID  . heparin  5,000 Units Subcutaneous 3 times per day  . insulin aspart  0-5 Units Subcutaneous QHS  . insulin aspart  0-9 Units Subcutaneous TID WC  . ipratropium-albuterol  3 mL Nebulization Q6H  . losartan  100 mg Oral Daily  . meloxicam  7.5 mg Oral Daily  . methylPREDNISolone (SOLU-MEDROL) injection  60 mg Intravenous Q6H  . miconazole  1 application Topical BID  . multivitamin-lutein  1 capsule Oral Daily  . pantoprazole  40 mg Oral Daily  . sertraline   150 mg Oral Daily  . sodium chloride  3 mL Intravenous Q12H   Continuous Infusions:  PRN Meds:.acetaminophen **OR** acetaminophen, ALPRAZolam, morphine injection, ondansetron **OR** ondansetron (ZOFRAN) IV, oxyCODONE   Data Review:   Micro Results Recent Results (from the past 240 hour(s))  Blood Culture (routine x 2)     Status: None (Preliminary result)   Collection Time: 04/09/15  1:00 AM  Result Value Ref Range Status   Specimen Description BLOOD LEFT ARM  Final   Special Requests BOTTLES DRAWN AEROBIC AND ANAEROBIC 5CC  Final   Culture NO GROWTH 4 DAYS  Final   Report Status PENDING  Incomplete  Blood Culture (routine x 2)     Status: None   Collection Time: 04/09/15  1:39 AM  Result Value Ref Range Status   Specimen Description BLOOD LEFT ASSIST CONTROL  Final   Special Requests BOTTLES DRAWN AEROBIC AND ANAEROBIC 3CC  Final   Culture  Setup Time   Final    GRAM POSITIVE COCCI IN CLUSTERS ANAEROBIC BOTTLE ONLY CRITICAL RESULT CALLED TO, READ BACK BY AND VERIFIED WITH: Earlie Server AT [id number] 04/10/15 DV    Culture   Final    COAGULASE NEGATIVE STAPHYLOCOCCUS Results consistent with contamination.    Report Status 04/13/2015 FINAL  Final  Urine culture     Status: None   Collection Time: 04/09/15  1:39 AM  Result Value Ref Range Status   Specimen Description URINE, CATHETERIZED  Final   Special Requests NONE  Final   Culture >=100,000 COLONIES/mL ESCHERICHIA COLI  Final   Report Status 04/11/2015 FINAL  Final   Organism ID, Bacteria ESCHERICHIA COLI  Final      Susceptibility   Escherichia coli - MIC*    AMPICILLIN 4 SENSITIVE Sensitive     CEFTAZIDIME <=1 SENSITIVE Sensitive  CEFAZOLIN <=4 SENSITIVE Sensitive     CEFTRIAXONE <=1 SENSITIVE Sensitive     CIPROFLOXACIN <=0.25 SENSITIVE Sensitive     GENTAMICIN <=1 SENSITIVE Sensitive     IMIPENEM <=0.25 SENSITIVE Sensitive     TRIMETH/SULFA <=20 SENSITIVE Sensitive     PIP/TAZO Value in next row Sensitive       SENSITIVE<=4    * >=100,000 COLONIES/mL ESCHERICHIA COLI  MRSA PCR Screening     Status: None   Collection Time: 04/09/15  5:33 AM  Result Value Ref Range Status   MRSA by PCR NEGATIVE NEGATIVE Final    Comment:        The GeneXpert MRSA Assay (FDA approved for NASAL specimens only), is one component of a comprehensive MRSA colonization surveillance program. It is not intended to diagnose MRSA infection nor to guide or monitor treatment for MRSA infections.     Radiology Reports Dg Chest 1 View  04/11/2015  CLINICAL DATA:  Increasing shortness of breath, personal history of non hodgkins lymphoma, uterine and breast cancer EXAM: CHEST 1 VIEW COMPARISON:  04/09/15 FINDINGS: Stable mild cardiac enlargement with vascular calcification of the aorta and aortic uncoiling. Mild central vascular congestion. Mild bibasilar atelectasis. No evidence of consolidation or edema. IMPRESSION: No acute findings. Stable cardiac enlargement and vascular congestion. Electronically Signed   By: Skipper Cliche M.D.   On: 04/11/2015 11:48   Ct Chest W Contrast  04/12/2015  CLINICAL DATA:  Shortness of breath, acute respiratory failure, possible aspiration pneumonia EXAM: CT CHEST WITH CONTRAST TECHNIQUE: Multidetector CT imaging of the chest was performed during intravenous contrast administration. CONTRAST:  61mL OMNIPAQUE IOHEXOL 300 MG/ML  SOLN COMPARISON:  04/11/2015 FINDINGS: Mediastinum/Nodes: Heart is top-normal in size. No pericardial effusion. Coronary atherosclerosis. Atherosclerotic calcifications aortic arch. No suspicious mediastinal, hilar, or axillary lymphadenopathy. Visualized thyroid is unremarkable. Lungs/Pleura: Patchy left lower lobe opacity, suspicious for pneumonia. Additional mild patchy opacity in the posterior right middle lobe and right lower lobe, possibly dependent atelectasis, multifocal pneumonia not excluded. Mild patchy/ ground-glass opacity in the anterior right lung apex, favored  to reflect pleural parenchymal scarring, although this appearance may also reflect additional site of aspiration. Evaluation of the lung parenchyma is constrained due to respiratory motion, although no definite pulmonary nodules are visualized. Underlying mild centrilobular emphysematous changes. No pleural effusion or pneumothorax. Upper abdomen: Visualized upper abdomen is notable for a 2.5 cm gallstone, a 1.3 cm anterior left upper pole renal cyst, and vascular calcifications. Musculoskeletal: Degenerative changes of the visualized thoracolumbar spine. IMPRESSION: Patchy left lower lobe opacity, suspicious for pneumonia. Additional mild patchy opacities in the right middle and lower lobes may reflect multifocal pneumonia (likely on the basis of aspiration) versus superimposed dependent atelectasis. Electronically Signed   By: Julian Hy M.D.   On: 04/12/2015 15:38   Dg Chest Port 1 View  04/09/2015  CLINICAL DATA:  Difficulty breathing, cough, and congestion and weakness for a few days. History of diabetes, hypertension, hyperlipidemia, breast cancer, non-Hodgkin's lymphoma. EXAM: PORTABLE CHEST 1 VIEW COMPARISON:  Chest radiograph March 16, 2014 FINDINGS: The cardiac silhouette is mildly enlarged, unchanged. Calcified tortuous aorta most often seen with hypertension. Pulmonary vascular congestion. Slight blunting of costophrenic angles. No focal consolidation. No pneumothorax. Soft tissue planes and included osseous structures are nonsuspicious. Surgical clips project in LEFT axilla. Additional punctate foreign bodies projecting at the thoracic and are likely external to the patient. IMPRESSION: Mild cardiomegaly and pulmonary vascular congestion. Minimal pleural thickening, less likely pleural effusions. Electronically Signed  By: Elon Alas M.D.   On: 04/09/2015 01:58     CBC  Recent Labs Lab 04/09/15 0100 04/10/15 0604 04/11/15 0522 04/12/15 0650  WBC 14.8* 10.1 8.9 10.1  HGB  9.9* 9.8* 10.5* 10.5*  HCT 30.7* 30.8* 32.3* 33.3*  PLT 174 173 192 207  MCV 80.5 80.5 81.2 81.2  MCH 25.8* 25.6* 26.3 25.7*  MCHC 32.1 31.8* 32.4 31.6*  RDW 15.4* 15.2* 15.1* 14.9*  LYMPHSABS 1.3  --   --   --   MONOABS 1.1*  --   --   --   EOSABS 0.1  --   --   --   BASOSABS 0.1  --   --   --     Chemistries   Recent Labs Lab 04/09/15 0100 04/10/15 0604 04/11/15 0522 04/12/15 0650  NA 138 136 142 140  K 4.2 3.9 4.1 3.7  CL 103 97* 96* 92*  CO2 32 33* 38* 39*  GLUCOSE 155* 144* 153* 161*  BUN 24* 22* 18 17  CREATININE 1.04* 1.11* 1.09* 0.91  CALCIUM 8.9 8.7* 9.0 8.9  AST 20  --   --   --   ALT 17  --   --   --   ALKPHOS 55  --   --   --   BILITOT 1.0  --   --   --    ------------------------------------------------------------------------------------------------------------------ estimated creatinine clearance is 52 mL/min (by C-G formula based on Cr of 0.91). ------------------------------------------------------------------------------------------------------------------ No results for input(s): HGBA1C in the last 72 hours. ------------------------------------------------------------------------------------------------------------------ No results for input(s): CHOL, HDL, LDLCALC, TRIG, CHOLHDL, LDLDIRECT in the last 72 hours. ------------------------------------------------------------------------------------------------------------------ No results for input(s): TSH, T4TOTAL, T3FREE, THYROIDAB in the last 72 hours.  Invalid input(s): FREET3 ------------------------------------------------------------------------------------------------------------------ No results for input(s): VITAMINB12, FOLATE, FERRITIN, TIBC, IRON, RETICCTPCT in the last 72 hours.  Coagulation profile  Recent Labs Lab 04/09/15 0100  INR 1.16    No results for input(s): DDIMER in the last 72 hours.  Cardiac Enzymes  Recent Labs Lab 04/09/15 0100  TROPONINI <0.03    ------------------------------------------------------------------------------------------------------------------ Invalid input(s): POCBNP    Assessment & Plan   A32 year old Caucasian female history of essential hypertension presenting with shortness of breath.  1.Sepsis, meeting septic criteria by temperature, leukocytosis present on arrival. Due to urinary tract infection, and aspiration pneumonia: Patient on Unasyn which should treat both the urine infection and aspiration pneumonia. She is very slow to improve.  2. Essential hypertension: Continue   Cozaar resume her Catapres 3. Acute respiratory failure: due to aspiration pneumonia, no evidence of CHF patient actually looks little dry today. Stop Lasix. Patient seen by speech on a modified diet 4. Type 2 diabetes non-insulin-requiring: Sliding scale insulin blood sugars elevated at low dose Lantus 5. GERD without esophagitis: Continue Protonix 6. anemia likely due to anemia of chronic disease   CODE STATUS discussed again with healthcare power of attorney she is agreeable to DO NOT RESUSCITATE, prognosis poor in this patient, I have reiterated this to the patient and her power of attorney. Patient states that she is "not ready to die ""     Code Status Orders        Start     Ordered   04/09/15 0248  Full code   Continuous     04/09/15 0248    Advance Directive Documentation        Most Recent Value   Type of Advance Directive  Healthcare Power of Attorney   Pre-existing out of facility  DNR order (yellow form or pink MOST form)     "MOST" Form in Place?             Consults  none  DVT prophylaxis: Heparin  Lab Results  Component Value Date   PLT 207 04/12/2015     Time Spent in minutes   78min.   Dustin Flock M.D on 04/13/2015 at 1:10 PM  Between 7am to 6pm - Pager - [phone]  After 6pm go to [email or web address] - password [email or web address] [facility]   Office  [phone]

## 2015-04-13 NOTE — Progress Notes (Signed)
Inpatient Diabetes Program Recommendations  AACE/ADA: New Consensus Statement on Inpatient Glycemic Control (2015)  Target Ranges:  Prepandial:   less than 140 mg/dL      Peak postprandial:   less than 180 mg/dL (1-2 hours)      Critically ill patients:  140 - 180 mg/dL   Results for EVERETTE, COSTENBADER (MRN [id number]) as of 04/13/2015 11:32  Ref. Range 04/12/2015 07:16 04/12/2015 11:04 04/12/2015 16:09 04/12/2015 20:43  Glucose-Capillary Latest Ref Range: 65-99 mg/dL 171 (H) 170 (H) 172 (H) 303 (H)    Results for TANNAH, VOSSLER (MRN [id number]) as of 04/13/2015 11:32  Ref. Range 04/13/2015 07:31  Glucose-Capillary Latest Ref Range: 65-99 mg/dL 217 (H)     Admit with: Sepsis (UTI versus Pneumonia)  History: DM  Home DM Meds: Glipizide 2.5 mg daily  Current Insulin Orders: Novolog Sensitive SSI (0-9 units) TID AC + HS     -Note IV Solumedrol 60 mg Q6 hours started yesterday at 2pm.  -Glucose levels on the rise likely due to steroids.     MD- Please consider the following while patient receiving IV steroids:  1. Start low dose basal insulin- Lantus 10 units daily (0.1 units/kg)  2. Increase Novolog SSI to Moderate scale (0-15 units) TID AC + HS (currently ordered as Sensitive scale)    --Will follow patient during hospitalization--  Wyn Quaker RN, MSN, CDE Diabetes Coordinator Inpatient Glycemic Control Team Team Pager: [phone] (8a-5p)

## 2015-04-13 NOTE — Care Management Important Message (Signed)
Important Message  Patient Details  Name: Sarah Orozco MRN: [id number] Date of Birth: August 13, 1927   Medicare Important Message Given:   (4th notification)    Shelbie Ammons, RN 04/13/2015, 8:38 AM

## 2015-04-13 NOTE — Clinical Social Work Note (Signed)
Clinical Social Worker provided bed offers to pt's POA. POA will speak with pt about where she wants to go. CSW also paged MD that POA is at bedside and would like to speak with him. CSW will continue to follow.  Darden Dates, MSW, LCSW Clinical Social Worker  [phone]

## 2015-04-13 NOTE — Progress Notes (Signed)
PT Cancellation Note  Patient Details Name: Sarah Orozco MRN: [id number] DOB: Apr 29, 1928   Cancelled Treatment:    Reason Eval/Treat Not Completed: Medical issues which prohibited therapy (Noted patient's initial PT order discontinued at this time.  Please re-consult as medically appropriate.)  Lynnie Koehler H. Owens Shark, PT, DPT, NCS 04/13/2015, 2:17 PM [phone]

## 2015-04-13 NOTE — Plan of Care (Signed)
Problem: Nutrition: Goal: Adequate nutrition will be maintained Outcome: Progressing Pt alert and oriented, tylenol for headache with relief. Continues on Iv antibiotics and iv solu-medrol. On O2 at 4 L, scheduled breathing tx around the clock. No respiratory distress noted. Foley in place. Continue to monitor

## 2015-04-14 ENCOUNTER — Encounter: Payer: Self-pay | Admitting: Specialist

## 2015-04-14 LAB — CBC
HCT: 35.8 % (ref 35.0–47.0)
HEMOGLOBIN: 11.6 g/dL — AB (ref 12.0–16.0)
MCH: 26.2 pg (ref 26.0–34.0)
MCHC: 32.3 g/dL (ref 32.0–36.0)
MCV: 81 fL (ref 80.0–100.0)
Platelets: 223 10*3/uL (ref 150–440)
RBC: 4.42 MIL/uL (ref 3.80–5.20)
RDW: 15 % — ABNORMAL HIGH (ref 11.5–14.5)
WBC: 13.7 10*3/uL — AB (ref 3.6–11.0)

## 2015-04-14 LAB — CULTURE, BLOOD (ROUTINE X 2): Culture: NO GROWTH

## 2015-04-14 LAB — BASIC METABOLIC PANEL
ANION GAP: 10 (ref 5–15)
BUN: 34 mg/dL — ABNORMAL HIGH (ref 6–20)
CALCIUM: 9.3 mg/dL (ref 8.9–10.3)
CO2: 40 mmol/L — AB (ref 22–32)
Chloride: 94 mmol/L — ABNORMAL LOW (ref 101–111)
Creatinine, Ser: 0.93 mg/dL (ref 0.44–1.00)
GFR, EST NON AFRICAN AMERICAN: 54 mL/min — AB (ref >60)
Glucose, Bld: 259 mg/dL — ABNORMAL HIGH (ref 65–99)
Potassium: 3.6 mmol/L (ref 3.5–5.1)
SODIUM: 144 mmol/L (ref 135–145)

## 2015-04-14 LAB — GLUCOSE, CAPILLARY
GLUCOSE-CAPILLARY: 214 mg/dL — AB (ref 65–99)
GLUCOSE-CAPILLARY: 263 mg/dL — AB (ref 65–99)
GLUCOSE-CAPILLARY: 284 mg/dL — AB (ref 65–99)
Glucose-Capillary: 324 mg/dL — ABNORMAL HIGH (ref 65–99)

## 2015-04-14 MED ORDER — ACETYLCYSTEINE 20 % IN SOLN
4.0000 mL | Freq: Two times a day (BID) | RESPIRATORY_TRACT | Status: DC
  Administered 2015-04-14 – 2015-04-16 (×4): 4 mL via RESPIRATORY_TRACT
  Filled 2015-04-14 (×4): qty 4

## 2015-04-14 MED ORDER — METHYLPREDNISOLONE SODIUM SUCC 125 MG IJ SOLR
60.0000 mg | Freq: Two times a day (BID) | INTRAMUSCULAR | Status: DC
  Administered 2015-04-15: 60 mg via INTRAVENOUS
  Filled 2015-04-14: qty 2

## 2015-04-14 MED ORDER — ACETYLCYSTEINE 20 % IN SOLN
RESPIRATORY_TRACT | Status: AC
  Filled 2015-04-14: qty 4

## 2015-04-14 NOTE — Plan of Care (Signed)
Problem: Nutrition: Goal: Adequate nutrition will be maintained Outcome: Progressing Pt alert and oriented. Continues on iv solu-medrol Q 6 hrs. And iv antibiotics. Foley in place. Turned pt Q 2 hrs. No c/o pain of nor respiratory distress noted. Continues on O2 at 4 L with sats 91 %. Continue to monitor.

## 2015-04-14 NOTE — Clinical Social Work Note (Signed)
Patient has had a bed offer from [facility]. CSW will extend this bed offer if able. Shela Leff MSW,LCSW [phone]

## 2015-04-14 NOTE — Progress Notes (Signed)
Assumed care at 1500, pt alert and oriented x4 at this time. Does not complain of pain or discomfort. Remains on thickened liquids. O2 @ 4L, Will continue to monitor

## 2015-04-14 NOTE — Progress Notes (Signed)
[facility] NAME: Sarah Orozco    MR#:  [id number]  DATE OF BIRTH:  1927/06/29  SUBJECTIVE:  CHIEF COMPLAINT:   Chief Complaint  Patient presents with  . Shortness of Breath   She here due to shortness of breath secondary to multifocal aspiration pneumonia. Respiratory status is stable today. No other focal complaints  REVIEW OF SYSTEMS:    Review of Systems  Constitutional: Negative for fever and chills.  HENT: Negative for congestion and tinnitus.   Eyes: Negative for blurred vision and double vision.  Respiratory: Positive for cough and shortness of breath. Negative for wheezing.   Cardiovascular: Negative for chest pain, orthopnea and PND.  Gastrointestinal: Negative for nausea, vomiting, abdominal pain and diarrhea.  Genitourinary: Negative for dysuria and hematuria.  Neurological: Positive for weakness. Negative for dizziness, sensory change and focal weakness.  All other systems reviewed and are negative.   Nutrition: Dysphagia 1 Tolerating Diet: Yes Tolerating PT: Await evaluation   DRUG ALLERGIES:   Allergies  Allergen Reactions  . Adhesive [Tape] Other (See Comments)    Nursing home records...unknown side effect  . Percocet [Oxycodone-Acetaminophen] Other (See Comments)    From nursing home records, unknown side effect    VITALS:  Blood pressure 168/62, pulse 80, temperature 98.1 F (36.7 C), temperature source Axillary, resp. rate 20, height 5\' 4"  (1.626 m), weight 100.154 kg (220 lb 12.8 oz), SpO2 91 %.  PHYSICAL EXAMINATION:   Physical Exam  GENERAL:  81 y.o.-year-old obese patient lying in the bed in no acute distress.  EYES: Pupils equal, round, reactive to light and accommodation. No scleral icterus. Extraocular muscles intact.  HEENT: Head atraumatic, normocephalic. Oropharynx and nasopharynx clear.  NECK:  Supple, no jugular venous distention. No thyroid enlargement, no tenderness.   LUNGS: Diffuse rhonchi, wheezing at the bases. Good air entry bilaterally. No use of accessory muscles of respiration.  CARDIOVASCULAR: S1, S2 RRR. No murmurs, rubs, or gallops.  ABDOMEN: Soft, nontender, nondistended. Bowel sounds present. No organomegaly or mass.  EXTREMITIES: No cyanosis, clubbing b/l, + 1 dependent edema b/l.     NEUROLOGIC: Cranial nerves II through XII are intact. No focal Motor or sensory deficits b/l.  Globally weak PSYCHIATRIC: The patient is alert and oriented x 3. Good affect SKIN: No obvious rash, lesion, or ulcer.    LABORATORY PANEL:   CBC  Recent Labs Lab 04/14/15 0432  WBC 13.7*  HGB 11.6*  HCT 35.8  PLT 223   ------------------------------------------------------------------------------------------------------------------  Chemistries   Recent Labs Lab 04/09/15 0100  04/14/15 0432  NA 138  < > 144  K 4.2  < > 3.6  CL 103  < > 94*  CO2 32  < > 40*  GLUCOSE 155*  < > 259*  BUN 24*  < > 34*  CREATININE 1.04*  < > 0.93  CALCIUM 8.9  < > 9.3  AST 20  --   --   ALT 17  --   --   ALKPHOS 55  --   --   BILITOT 1.0  --   --   < > = values in this interval not displayed. ------------------------------------------------------------------------------------------------------------------  Cardiac Enzymes  Recent Labs Lab 04/09/15 0100  TROPONINI <0.03   ------------------------------------------------------------------------------------------------------------------  RADIOLOGY:  Ct Chest W Contrast  04/12/2015  CLINICAL DATA:  Shortness of breath, acute respiratory failure, possible aspiration pneumonia EXAM: CT CHEST WITH CONTRAST TECHNIQUE: Multidetector CT imaging of the chest was performed  during intravenous contrast administration. CONTRAST:  61mL OMNIPAQUE IOHEXOL 300 MG/ML  SOLN COMPARISON:  04/11/2015 FINDINGS: Mediastinum/Nodes: Heart is top-normal in size. No pericardial effusion. Coronary atherosclerosis. Atherosclerotic  calcifications aortic arch. No suspicious mediastinal, hilar, or axillary lymphadenopathy. Visualized thyroid is unremarkable. Lungs/Pleura: Patchy left lower lobe opacity, suspicious for pneumonia. Additional mild patchy opacity in the posterior right middle lobe and right lower lobe, possibly dependent atelectasis, multifocal pneumonia not excluded. Mild patchy/ ground-glass opacity in the anterior right lung apex, favored to reflect pleural parenchymal scarring, although this appearance may also reflect additional site of aspiration. Evaluation of the lung parenchyma is constrained due to respiratory motion, although no definite pulmonary nodules are visualized. Underlying mild centrilobular emphysematous changes. No pleural effusion or pneumothorax. Upper abdomen: Visualized upper abdomen is notable for a 2.5 cm gallstone, a 1.3 cm anterior left upper pole renal cyst, and vascular calcifications. Musculoskeletal: Degenerative changes of the visualized thoracolumbar spine. IMPRESSION: Patchy left lower lobe opacity, suspicious for pneumonia. Additional mild patchy opacities in the right middle and lower lobes may reflect multifocal pneumonia (likely on the basis of aspiration) versus superimposed dependent atelectasis. Electronically Signed   By: Julian Hy M.D.   On: 04/12/2015 15:38     ASSESSMENT AND PLAN:   81 year old female with past medical history of hypertension, hyperlipidemia, obstructive sleep apnea, type 2 diabetes without complication, osteo-arthritis, GERD, history of non-Hodgkin's lymphoma, urinary incontinence who presented to the hospital due to shortness of breath.  #1 sepsis-this was the presenting diagnosis on admission. Patient was then given her fever, leukocytosis. The source is likely pneumonia given her CT scan findings. This is likely aspiration pneumonia. -Continue IV Unasyn, clinically patient is afebrile, hemodynamically stable. Cultures so far negative.  #2 acute  respiratory failure with hypoxia-this is likely secondary to aspiration pneumonia. -Clinically improving. Bronchospasm improved. We will wean IV steroids. -Continue IV Unasyn.  #3 hypertension-hemodynamically stable. Continue losartan  #4 type 2 diabetes without consultation-continue sliding scale insulin, Lantus. Blood sugar stable  #5 depression-continue Zoloft.  #6 GERD-continue Protonix.  #7 osteoarthritis-continue meloxicam.  #8 anxiety-continue Xanax.    All the records are reviewed and case discussed with Care Management/Social Workerr. Management plans discussed with the patient, family and they are in agreement.  CODE STATUS: DO NOT RESUSCITATE  DVT Prophylaxis: Heparin subcutaneous  TOTAL TIME TAKING CARE OF THIS PATIENT: 25 minutes.   POSSIBLE D/C IN 1-2 DAYS, DEPENDING ON CLINICAL CONDITION.   Henreitta Leber M.D on 04/14/2015 at 1:22 PM  Between 7am to 6pm - Pager - [phone]  After 6pm go to [email or web address] - password [email or web address] [facility]  [facility]  Office  [phone]  CC: Primary care physician; Marinda Elk, MD

## 2015-04-14 NOTE — Progress Notes (Signed)
Foley dc'd per orders.

## 2015-04-15 LAB — CBC
HEMATOCRIT: 36.3 % (ref 35.0–47.0)
HEMOGLOBIN: 11.4 g/dL — AB (ref 12.0–16.0)
MCH: 25.6 pg — AB (ref 26.0–34.0)
MCHC: 31.4 g/dL — AB (ref 32.0–36.0)
MCV: 81.7 fL (ref 80.0–100.0)
Platelets: 226 10*3/uL (ref 150–440)
RBC: 4.44 MIL/uL (ref 3.80–5.20)
RDW: 15.1 % — ABNORMAL HIGH (ref 11.5–14.5)
WBC: 14.6 10*3/uL — ABNORMAL HIGH (ref 3.6–11.0)

## 2015-04-15 LAB — GLUCOSE, CAPILLARY
GLUCOSE-CAPILLARY: 299 mg/dL — AB (ref 65–99)
Glucose-Capillary: 243 mg/dL — ABNORMAL HIGH (ref 65–99)
Glucose-Capillary: 298 mg/dL — ABNORMAL HIGH (ref 65–99)
Glucose-Capillary: 280 mg/dL — ABNORMAL HIGH (ref 65–99)

## 2015-04-15 MED ORDER — INSULIN GLARGINE 100 UNIT/ML ~~LOC~~ SOLN
12.0000 [IU] | Freq: Every day | SUBCUTANEOUS | Status: DC
  Administered 2015-04-16: 10:00:00 12 [IU] via SUBCUTANEOUS
  Filled 2015-04-15: qty 0.12

## 2015-04-15 MED ORDER — METHYLPREDNISOLONE SODIUM SUCC 125 MG IJ SOLR
60.0000 mg | Freq: Every day | INTRAMUSCULAR | Status: DC
Start: 2015-04-16 — End: 2015-04-16
  Administered 2015-04-16: 09:00:00 60 mg via INTRAVENOUS
  Filled 2015-04-15: qty 2

## 2015-04-15 NOTE — Progress Notes (Signed)
Physical Therapy Evaluation Patient Details Name: Sarah Orozco MRN: [id number] DOB: 06-16-1927 Today's Date: 04/15/2015   History of Present Illness  Patient is a confused 81 y.o. female admitted on 7 Nov. after experiencing 4 days of SOB. Was diagnosed with multifocal aspiration pneumonia.  PMH includes pulmonary vascular congestion, UTI, acute respiratory failure with hypoxia/hypercapnia, HLD, DM.  Clinical Impression  Upon PT entering room, patient immediately told PT that she couldn't move because too many things were attached to her. Patient's mood fluctuated between willingness to participate and unwillingness throughout session, including mid-transfer to EOB. Had tendency to lean posteriorly but stated she wanted to get into chair. PLOF not completely known due to confusion; however, it seems patient lived at [facility] and was receiving PT through [place]. Because patient required +2 assistance to transfer to chair due to decreased muscle strength/endurance and balance, it is believed that she will benefit from skilled PT for duration of hospital stay and f/u at SNF.     Follow Up Recommendations SNF    Equipment Recommendations  None recommended by PT    Recommendations for Other Services       Precautions / Restrictions Precautions Precautions: Fall Restrictions Weight Bearing Restrictions: No      Mobility  Bed Mobility Overal bed mobility: Needs Assistance Bed Mobility: Supine to Sit     Supine to sit: Max assist;HOB elevated     General bed mobility comments: Patient required maximal assistance to get to EOB. Would fluctuate between being motivated to saying that she couldn't do it, complaining that too many things were strapped to her (O2 nasal canula).  Transfers Overall transfer level: Needs assistance Equipment used: Rolling walker (2 wheeled) Transfers: Stand Pivot Transfers   Stand pivot transfers: +2 physical assistance;+2 safety/equipment        General transfer comment: Patient required +2 assistance to stand and pivot into chair. Patient had tendency to lean posteriorly and keep knees flexed.   Ambulation/Gait                Stairs            Wheelchair Mobility    Modified Rankin (Stroke Patients Only)       Balance Overall balance assessment: Needs assistance Sitting-balance support: Bilateral upper extremity supported Sitting balance-Leahy Scale: Fair Sitting balance - Comments: Patient had tendency to lean posteriorly. HOB elevated to improve support.   Standing balance support: Bilateral upper extremity supported Standing balance-Leahy Scale: Poor Standing balance comment: Patient required +2 assistance to maintain upright posture in standing.                              Pertinent Vitals/Pain Pain Assessment: 0-10 Pain Score: 4  Pain Location: Everywhere Pain Descriptors / Indicators: Aching Pain Intervention(s): Limited activity within patient's tolerance;Monitored during session    Home Living Family/patient expects to be discharged to:: Other (Comment) ([facility])               Home Equipment: Walker - 2 wheels      Prior Function Level of Independence: Needs assistance   Gait / Transfers Assistance Needed: Unknown; believe modified independent  ADL's / Homemaking Assistance Needed: Unknown  Comments: Patient is a poor historian; believe she lived in independent living with RW     Hand Dominance        Extremity/Trunk Assessment   Upper Extremity Assessment: Generalized weakness  Lower Extremity Assessment: Generalized weakness;Difficult to assess due to impaired cognition      Cervical / Trunk Assessment: Kyphotic  Communication      Cognition Arousal/Alertness: Awake/alert Behavior During Therapy: Agitated;Anxious Overall Cognitive Status: Difficult to assess                      General Comments      Exercises         Assessment/Plan    PT Assessment Patient needs continued PT services  PT Diagnosis Difficulty walking;Generalized weakness;Acute pain;Altered mental status   PT Problem List Decreased strength;Decreased range of motion;Decreased activity tolerance;Decreased balance;Decreased mobility;Decreased cognition;Decreased knowledge of use of DME;Decreased safety awareness;Cardiopulmonary status limiting activity;Pain;Obesity  PT Treatment Interventions DME instruction;Gait training;Functional mobility training;Therapeutic activities;Therapeutic exercise;Balance training;Cognitive remediation;Patient/family education   PT Goals (Current goals can be found in the Care Plan section) Acute Rehab PT Goals Patient Stated Goal: "I want to get out of here." PT Goal Formulation: With patient Time For Goal Achievement: 04/29/15 Potential to Achieve Goals: Fair    Frequency Min 2X/week   Barriers to discharge Inaccessible home environment;Decreased caregiver support      Co-evaluation               End of Session Equipment Utilized During Treatment: Gait belt;Oxygen Activity Tolerance: Treatment limited secondary to agitation;Patient limited by fatigue Patient left: in chair;with call bell/phone within reach;with chair alarm set Nurse Communication: Mobility status         Time: [id number] PT Time Calculation (min) (ACUTE ONLY): 37 min   Charges:   PT Evaluation $Initial PT Evaluation Tier I: 1 Procedure     PT G Codes:        Dorice Lamas, PT, DPT 04/15/2015, 11:58 AM

## 2015-04-15 NOTE — Plan of Care (Signed)
Problem: Nutrition: Goal: Adequate nutrition will be maintained Outcome: Progressing No c/o pain nor respiratory distress noted. Continues on 4L of O2, iv antibiotics and iv solu-medrol. Rested thru out this shift. Continue to monitor.

## 2015-04-15 NOTE — Progress Notes (Signed)
[place] [facility] [place] NAME: Sarah Orozco    MR#:  [id number]  DATE OF BIRTH:  30-Aug-1927  SUBJECTIVE:  CHIEF COMPLAINT:   Chief Complaint  Patient presents with  . Shortness of Breath   She here due to shortness of breath secondary to multifocal aspiration pneumonia. Still has a cough and anxious at times.  Shortness of breath improved.   REVIEW OF SYSTEMS:    Review of Systems  Constitutional: Negative for fever and chills.  HENT: Negative for congestion and tinnitus.   Eyes: Negative for blurred vision and double vision.  Respiratory: Positive for cough and shortness of breath. Negative for wheezing.   Cardiovascular: Negative for chest pain, orthopnea and PND.  Gastrointestinal: Negative for nausea, vomiting, abdominal pain and diarrhea.  Genitourinary: Negative for dysuria and hematuria.  Neurological: Positive for weakness. Negative for dizziness, sensory change and focal weakness.  All other systems reviewed and are negative.   Nutrition: Dysphagia 1 Tolerating Diet: Yes Tolerating PT: Await evaluation   DRUG ALLERGIES:   Allergies  Allergen Reactions  . Adhesive [Tape] Other (See Comments)    Nursing home records...unknown side effect  . Percocet [Oxycodone-Acetaminophen] Other (See Comments)    From nursing home records, unknown side effect    VITALS:  Blood pressure 168/78, pulse 90, temperature 97.9 F (36.6 C), temperature source Oral, resp. rate 20, height 5' 4"  (1.626 m), weight 100.154 kg (220 lb 12.8 oz), SpO2 96 %.  PHYSICAL EXAMINATION:   Physical Exam  GENERAL:  81 y.o.-year-old obese patient lying in the bed in no acute distress.  EYES: Pupils equal, round, reactive to light and accommodation. No scleral icterus. Extraocular muscles intact.  HEENT: Head atraumatic, normocephalic. Oropharynx and nasopharynx clear.  NECK:  Supple, no jugular venous distention. No thyroid enlargement, no  tenderness.  LUNGS: Diffuse rhonchi, rales b/l.  Good air entry bilaterally. No use of accessory muscles of respiration.  CARDIOVASCULAR: S1, S2 RRR. No murmurs, rubs, or gallops.  ABDOMEN: Soft, nontender, nondistended. Bowel sounds present. No organomegaly or mass.  EXTREMITIES: No cyanosis, clubbing b/l, + 1 dependent edema b/l.     NEUROLOGIC: Cranial nerves II through XII are intact. No focal Motor or sensory deficits b/l.  Globally weak PSYCHIATRIC: The patient is alert and oriented x 2. Very anxious SKIN: No obvious rash, lesion, or ulcer.    LABORATORY PANEL:   CBC  Recent Labs Lab 04/15/15 0457  WBC 14.6*  HGB 11.4*  HCT 36.3  PLT 226   ------------------------------------------------------------------------------------------------------------------  Chemistries   Recent Labs Lab 04/09/15 0100  04/14/15 0432  NA 138  < > 144  K 4.2  < > 3.6  CL 103  < > 94*  CO2 32  < > 40*  GLUCOSE 155*  < > 259*  BUN 24*  < > 34*  CREATININE 1.04*  < > 0.93  CALCIUM 8.9  < > 9.3  AST 20  --   --   ALT 17  --   --   ALKPHOS 55  --   --   BILITOT 1.0  --   --   < > = values in this interval not displayed. ------------------------------------------------------------------------------------------------------------------  Cardiac Enzymes  Recent Labs Lab 04/09/15 0100  TROPONINI <0.03   ------------------------------------------------------------------------------------------------------------------  RADIOLOGY:  No results found.   ASSESSMENT AND PLAN:   81 year old female with past medical history of hypertension, hyperlipidemia, obstructive sleep apnea, type 2 diabetes without complication, osteo-arthritis, GERD,  history of non-Hodgkin's lymphoma, urinary incontinence who presented to the hospital due to shortness of breath.  #1 sepsis-this was the presenting diagnosis on admission. Patient met criteria due to fever, leukocytosis. The source is likely pneumonia  given her CT scan findings. This is likely aspiration pneumonia. -Continue IV Unasyn, Cultures so far negative.  #2 acute respiratory failure with hypoxia-this is likely secondary to aspiration pneumonia. -Clinically improving. Bronchospasm improved. Cont. To wean IV steroids. -Continue IV Unasyn.  #3 hypertension-hemodynamically stable. Continue losartan  #4 type 2 diabetes without consultation-continue sliding scale insulin, Lantus. Blood sugar stable  #5 depression-continue Zoloft.  #6 GERD-continue Protonix.  #7 osteoarthritis-continue meloxicam.  #8 anxiety-continue Xanax.  Await physical therapy evaluation. Possible DC the next 1-2 days but may need short-term rehabilitation and will await PT eval.  All the records are reviewed and case discussed with Care Management/Social Workerr. Management plans discussed with the patient, family and they are in agreement.  CODE STATUS: DO NOT RESUSCITATE  DVT Prophylaxis: Heparin subcutaneous  TOTAL TIME TAKING CARE OF THIS PATIENT: 25 minutes.   POSSIBLE D/C IN 1-2 DAYS, DEPENDING ON CLINICAL CONDITION.   Henreitta Leber M.D on 04/15/2015 at 11:56 AM  Between 7am to 6pm - Pager - [phone]  After 6pm go to [email or web address] - password [email or web address] [facility]  [place] [facility]  Office  [phone]  CC: Primary care physician; Sarah Elk, MD

## 2015-04-15 NOTE — Plan of Care (Signed)
Problem: Nutrition: Goal: Adequate nutrition will be maintained Outcome: Progressing VSS, remains on 4 liters oxygen saturations mid 90's, patient does get anxious at times, worked with PT and sat up in chair most of day, takes meds crushed in applesauce.

## 2015-04-16 LAB — GLUCOSE, CAPILLARY
Glucose-Capillary: 186 mg/dL — ABNORMAL HIGH (ref 65–99)
Glucose-Capillary: 209 mg/dL — ABNORMAL HIGH (ref 65–99)

## 2015-04-16 MED ORDER — IPRATROPIUM-ALBUTEROL 0.5-2.5 (3) MG/3ML IN SOLN
3.0000 mL | Freq: Three times a day (TID) | RESPIRATORY_TRACT | Status: DC
  Administered 2015-04-16 (×2): 3 mL via RESPIRATORY_TRACT
  Filled 2015-04-16 (×2): qty 3

## 2015-04-16 MED ORDER — AMOXICILLIN-POT CLAVULANATE 875-125 MG PO TABS: 1.0000 | ORAL_TABLET | Freq: Two times a day (BID) | ORAL | Status: DC

## 2015-04-16 MED ORDER — IPRATROPIUM-ALBUTEROL 0.5-2.5 (3) MG/3ML IN SOLN: 3.0000 mL | Freq: Four times a day (QID) | RESPIRATORY_TRACT | Status: DC | PRN

## 2015-04-16 NOTE — Clinical Social Work Placement (Signed)
   CLINICAL SOCIAL WORK PLACEMENT  NOTE  Date:  04/16/2015  Patient Details  Name: Sarah Orozco MRN: [id number] Date of Birth: 1928/05/30  Clinical Social Work is seeking post-discharge placement for this patient at the [place] level of care (*CSW will initial, date and re-position this form in  chart as items are completed):  Yes   Patient/family provided with [facility] Work Department's list of facilities offering this level of care within the geographic area requested by the patient (or if unable, by the patient's family).  Yes   Patient/family informed of their freedom to choose among providers that offer the needed level of care, that participate in Medicare, Medicaid or managed care program needed by the patient, have an available bed and are willing to accept the patient.  Yes   Patient/family informed of [facility]'s ownership interest in [facility] and [facility], as well as of the fact that they are under no obligation to receive care at these facilities.  PASRR submitted to EDS on       PASRR number received on       Existing PASRR number confirmed on 04/16/15     FL2 transmitted to all facilities in geographic area requested by pt/family on 04/13/15     FL2 transmitted to all facilities within larger geographic area on       Patient informed that his/her managed care company has contracts with or will negotiate with certain facilities, including the following:        Yes   Patient/family informed of bed offers received.  Patient chooses bed at  [facility])     Physician recommends and patient chooses bed at  (SNF for STR, then LTC at [facility])    Patient to be transferred to  [facility]) on 04/16/15.  Patient to be transferred to facility by  [facility])     Patient family notified on 04/16/15 of transfer.  Name of family member notified:   Arlis Porta, [place])     PHYSICIAN       Additional  Comment:    _______________________________________________ Darden Dates, LCSW 04/16/2015, 2:39 PM

## 2015-04-16 NOTE — Clinical Social Work Note (Signed)
CSW confirmed bed choice with POA. Choice is [facility]. CSW confirmed bed offer. [facility] is able to accept pt today. CSW verified a valid PASARR. CSW sent discharge information to facility. Pt's POA will be signing admission paperwork at 3 PM, at that time EMS transportation will be arranged. RN to call report. CSW is signing off as no further needs identified.   Darden Dates, MSW, LCSW Clinical Social Worker  [phone]

## 2015-04-16 NOTE — Plan of Care (Signed)
Problem: Safety: Goal: Ability to remain free from injury will improve Outcome: Progressing Patient continues with muscular weakness.  She is on high fall precautions.  Education provided about need to call nurse for needs and to not leave bed - patient compliant.  Bed in lowest position with wheels locked and call bell within reach.  Bed alarm on throughout shift.  Patient without injury this shift.  Problem: Pain Managment: Goal: General experience of comfort will improve Outcome: Progressing Patient with complaint of headache this shift.  Tylenol given with relief stated upon reassessment.    Problem: Physical Regulation: Goal: Will remain free from infection Outcome: Progressing Patient afebrile and VSS.  Unasyn administered per eMAR.  BP 173/64 mmHg  Pulse 77  Temp(Src) 97.9 F (36.6 C) (Oral)  Resp 18  Ht 5\' 4"  (1.626 m)  Wt 220 lb 12.8 oz (100.154 kg)  BMI 37.88 kg/m2  SpO2 99%

## 2015-04-16 NOTE — Discharge Summary (Addendum)
[name]    MR#:  [id number]  DATE OF BIRTH:  1927-07-02  DATE OF ADMISSION:  04/09/2015 ADMITTING PHYSICIAN: Lytle Butte, MD  DATE OF DISCHARGE: 04/16/2015  PRIMARY CARE PHYSICIAN: Marinda Elk, MD    ADMISSION DIAGNOSIS:  Pulmonary vascular congestion 99991111 Complicated UTI (urinary tract infection) [N39.0] Acute respiratory failure with hypoxia and hypercapnia (HCC) [J96.01, J96.02] Sepsis, due to unspecified organism ([place]) [A41.9]  DISCHARGE DIAGNOSIS:  Active Problems:   Sepsis ([place])  aspiration pneumonia Acute on chronic hypoxic respiratory failure, now requiring oxygen via nasal cannula SECONDARY DIAGNOSIS:   Past Medical History  Diagnosis Date  . Mitral valve insufficiency   . Diabetes mellitus without complication ([place])   . Sleep apnea   . Hyperlipidemia   . Hypertension   . History of breast cancer   . Anemia   . Urinary incontinence   . Osteoarthritis   . GERD (gastroesophageal reflux disease)   . History of uterine cancer   . Non-Hodgkin's lymphoma ([place])   . Incontinence    HOSPITAL COURSE:  81 y.o. female with a known history of hyperlipidemia unspecified, type 2 diabetes non-insulin-requiring presenting with shortness of breath and was admitted for sepsis, due to aspiration pneumonia.  Please see Dr. Samantha Crimes dictated history and physical for further details.  Patient was found to have acute on chronic hypoxic respiratory failure secondary to aspiration pneumonia.  She was slowly improving with broad-spectrum IV antibiotics.  She was evaluated by physical therapy and was recommended short-term rehabilitation, where she is being discharged in stable condition.   She and her family members are in agreement with the discharge plan and is being discharged to rehabilitation in stable condition, although she is at very high risk for recurrent aspiration and  hospitalization due to same. DISCHARGE CONDITIONS:  stable CONSULTS OBTAINED:  Treatment Team:  Lytle Butte, MD  DRUG ALLERGIES:   Allergies  Allergen Reactions  . Adhesive [Tape] Other (See Comments)    Nursing home records...unknown side effect  . Percocet [Oxycodone-Acetaminophen] Other (See Comments)    From nursing home records, unknown side effect   DISCHARGE MEDICATIONS:   Current Discharge Medication List    START taking these medications   Details  amoxicillin-clavulanate (AUGMENTIN) 875-125 MG tablet Take 1 tablet by mouth 2 (two) times daily. Qty: 6 tablet, Refills: 0      CONTINUE these medications which have NOT CHANGED   Details  acetaminophen (TYLENOL) 325 MG tablet Take 325 mg by mouth every 6 (six) hours as needed.     ALPRAZolam (XANAX) 0.25 MG tablet Take 0.25 mg by mouth 2 (two) times daily as needed for anxiety.    Calcium Carbonate-Vitamin D (CALCIUM 600+D) 600-400 MG-UNIT per tablet Take 1 tablet by mouth 2 (two) times daily.    cloNIDine (CATAPRES) 0.1 MG tablet Take 1 tablet (0.1 mg total) by mouth 2 (two) times daily. Qty: 60 tablet, Refills: 3    Elastic Bandages & Supports (T.E.D. BELOW KNEE/S-REGULAR) MISC by Does not apply route.    glipiZIDE (GLUCOTROL) 5 MG tablet Take 2.5 mg by mouth daily before breakfast.    hydrochlorothiazide (HYDRODIURIL) 25 MG tablet Take 1 tablet (25 mg total) by mouth daily. Qty: 30 tablet, Refills: 11    losartan (COZAAR) 50 MG tablet Take 2 tablets (100 mg total) by mouth daily. Qty: 60 tablet, Refills: 6    meloxicam (MOBIC) 7.5 MG tablet Take  7.5 mg by mouth daily.    multivitamin-lutein (OCUVITE-LUTEIN) CAPS capsule Take 1 capsule by mouth daily.    nystatin (MYCOSTATIN/NYSTOP) 100000 UNIT/GM POWD Apply 1-5 g topically 3 (three) times daily.    omeprazole (PRILOSEC) 20 MG capsule Take 20 mg by mouth daily.    sertraline (ZOLOFT) 100 MG tablet Take 150 mg by mouth daily.       STOP taking these  medications     amoxicillin (AMOXIL) 500 MG capsule      miconazole (MICOTIN) 2 % cream        DISCHARGE INSTRUCTIONS:   DIET:  Cardiac diet and nectar thick liquids with pured diet.  Strict aspiration precautions all the time Meal assistance with every meal with tray set up.  Sitting upright Please send Nectar juices and tea, no coffee or milk. Please send 2 extra spoons on trays. Yogurt TID meals. Likes gravy on mashed potatoes. Oatmeal w/ 2 splenda and butters at breakfast meals. DISCHARGE CONDITION:  Stable  ACTIVITY:  Activity as tolerated  OXYGEN:  Home Oxygen: Yes.     Oxygen Delivery: 2 liters/min via Patient connected to nasal cannula oxygen  DISCHARGE LOCATION:  nursing home   If you experience worsening of your admission symptoms, develop shortness of breath, life threatening emergency, suicidal or homicidal thoughts you must seek medical attention immediately by calling 911 or calling your MD immediately  if symptoms less severe.  You Must read complete instructions/literature along with all the possible adverse reactions/side effects for all the Medicines you take and that have been prescribed to you. Take any new Medicines after you have completely understood and accpet all the possible adverse reactions/side effects.   Please note  You were cared for by a hospitalist during your hospital stay. If you have any questions about your discharge medications or the care you received while you were in the hospital after you are discharged, you can call the unit and asked to speak with the hospitalist on call if the hospitalist that took care of you is not available. Once you are discharged, your primary care physician will handle any further medical issues. Please note that NO REFILLS for any discharge medications will be authorized once you are discharged, as it is imperative that you return to your primary care physician (or establish a relationship with a primary care  physician if you do not have one) for your aftercare needs so that they can reassess your need for medications and monitor your lab values.    On the day of Discharge: VITAL SIGNS:  Blood pressure 173/64, pulse 77, temperature 97.9 F (36.6 C), temperature source Oral, resp. rate 18, height 5\' 4"  (1.626 m), weight 100.154 kg (220 lb 12.8 oz), SpO2 98 %. PHYSICAL EXAMINATION:  GENERAL:  81 y.o.-year-old patient lying in the bed with no acute distress.  EYES: Pupils equal, round, reactive to light and accommodation. No scleral icterus. Extraocular muscles intact.  HEENT: Head atraumatic, normocephalic. Oropharynx and nasopharynx clear.  NECK:  Supple, no jugular venous distention. No thyroid enlargement, no tenderness.  LUNGS: Normal breath sounds bilaterally, no wheezing, rales,rhonchi or crepitation. No use of accessory muscles of respiration.  CARDIOVASCULAR: S1, S2 normal. No murmurs, rubs, or gallops.  ABDOMEN: Soft, non-tender, non-distended. Bowel sounds present. No organomegaly or mass.  EXTREMITIES: No pedal edema, cyanosis, or clubbing.  NEUROLOGIC: Cranial nerves II through XII are intact. Muscle strength 5/5 in all extremities. Sensation intact. Gait not checked.  PSYCHIATRIC: The patient is alert and  oriented x 3.  SKIN: No obvious rash, lesion, or ulcer.  DATA REVIEW:   CBC  Recent Labs Lab 04/15/15 0457  WBC 14.6*  HGB 11.4*  HCT 36.3  PLT 226    Chemistries   Recent Labs Lab 04/14/15 0432  NA 144  K 3.6  CL 94*  CO2 40*  GLUCOSE 259*  BUN 34*  CREATININE 0.93  CALCIUM 9.3    Management plans discussed with the patient, family (I have talked with Jana Half, who is her healthcare power of attorney and her daughter and she understands her poor prognosis and high risk for recurrent aspiration) and they are in agreement.  I also requested to consider palliative care consultation while at the facility.  If she continues to have worsening of her clinical  condition.  After STR stay over, please consider Long term placement at Nursing home.  CODE STATUS: DO NOT RESUSCITATE  TOTAL TIME TAKING CARE OF THIS PATIENT: 55 minutes.    [facility], Gael Londo M.D on 04/16/2015 at 10:08 AM  Between 7am to 6pm - Pager - [phone]  After 6pm go to [email or web address] - password [email or web address] [place] [facility]  Office  [phone]  CC: Primary care physician; Marinda Elk, MD Esmond Plants MD

## 2015-04-16 NOTE — Care Management Important Message (Signed)
Important Message  Patient Details  Name: Sarah Orozco MRN: [id number] Date of Birth: 09/10/1927   Medicare Important Message Given:  Yes    Shelbie Ammons, RN 04/16/2015, 9:01 AM

## 2015-04-16 NOTE — Discharge Instructions (Signed)
Aspiration Pneumonia  Aspiration pneumonia is an infection in your lungs. It occurs when food, liquid, or stomach contents (vomit) are inhaled (aspirated) into your lungs. When these things get into your lungs, swelling (inflammation) and infection can occur. This can make it difficult for you to breathe. Aspiration pneumonia is a serious condition and can be life threatening. RISK FACTORS Aspiration pneumonia is more likely to occur when a person's cough (gag) reflex or ability to swallow has been decreased. Some things that can do this include:   Having a brain injury or disease, such as stroke, seizures, Parkinson's disease, dementia, or amyotrophic lateral sclerosis (ALS).   Being given general anesthetic for procedures.   Being in a coma (unconscious).   Having a narrowing of the tube that carries food to the stomach (esophagus).   Drinking too much alcohol. If a person passes out and vomits, vomit can be swallowed into the lungs.   Taking certain medicines, such as tranquilizers or sedatives.  SIGNS AND SYMPTOMS   Coughing after swallowing food or liquids.   Breathing problems, such as wheezing or shortness of breath.   Bluish skin. This can be caused by lack of oxygen.   Coughing up food or mucus. The mucus might contain blood, greenish material, or yellowish-white fluid (pus).   Fever.   Chest pain.   Being more tired than usual (fatigue).   Sweating more than usual.   Bad breath.  DIAGNOSIS  A physical exam will be done. During the exam, the health care provider will listen to your lungs with a stethoscope to check for:   Crackling sounds in the lungs.  Decreased breath sounds.  A rapid heartbeat. Various tests may be ordered. These may include:   Chest X-ray.   CT scan.   Swallowing study. This test looks at how food is swallowed and whether it goes into your breathing tube (trachea) or food pipe (esophagus).   Sputum culture. Saliva and  mucus (sputum) are collected from the lungs or the tubes that carry air to the lungs (bronchi). The sputum is then tested for bacteria.   Bronchoscopy. This test uses a flexible tube (bronchoscope) to see inside the lungs. TREATMENT  Treatment will usually include antibiotic medicines. Other medicines may also be used to reduce fever or pain. You may need to be treated in the hospital. In the hospital, your breathing will be carefully monitored. Depending on how well you are breathing, you may need to be given oxygen, or you may need breathing support from a breathing machine (ventilator). For people who fail a swallowing study, a feeding tube might be placed in the stomach, or they may be asked to avoid certain food textures or liquids when they eat. HOME CARE INSTRUCTIONS   Carefully follow any special eating instructions you were given, such as avoiding certain food textures or thickening liquids. This reduces the risk of developing aspiration pneumonia again.  Only take over-the-counter or prescription medicines as directed by your health care provider. Follow the directions carefully.   If you were prescribed antibiotics, take them as directed. Finish them even if you start to feel better.   Rest as instructed by your health care provider.   Keep all follow-up appointments with your health care provider.  SEEK MEDICAL CARE IF:   You develop worsening shortness of breath, wheezing, or difficulty breathing.   You develop a fever.   You have chest pain.  MAKE SURE YOU:   Understand these instructions.  Will watch   your condition.  Will get help right away if you are not doing well or get worse.   This information is not intended to replace advice given to you by your health care provider. Make sure you discuss any questions you have with your health care provider.   Document Released: 03/16/2009 Document Revised: 05/24/2013 Document Reviewed: 11/04/2012 Elsevier  Interactive Patient Education 2016 [employer].  

## 2015-04-19 ENCOUNTER — Non-Acute Institutional Stay (SKILLED_NURSING_FACILITY): Payer: Medicare Other | Admitting: Internal Medicine

## 2015-04-19 DIAGNOSIS — E119 Type 2 diabetes mellitus without complications: Secondary | ICD-10-CM | POA: Diagnosis not present

## 2015-04-19 DIAGNOSIS — J9621 Acute and chronic respiratory failure with hypoxia: Secondary | ICD-10-CM

## 2015-04-19 DIAGNOSIS — E669 Obesity, unspecified: Secondary | ICD-10-CM

## 2015-04-19 DIAGNOSIS — D638 Anemia in other chronic diseases classified elsewhere: Secondary | ICD-10-CM | POA: Diagnosis not present

## 2015-04-19 DIAGNOSIS — R21 Rash and other nonspecific skin eruption: Secondary | ICD-10-CM

## 2015-04-19 DIAGNOSIS — I10 Essential (primary) hypertension: Secondary | ICD-10-CM | POA: Diagnosis not present

## 2015-04-19 DIAGNOSIS — R5381 Other malaise: Secondary | ICD-10-CM

## 2015-04-19 DIAGNOSIS — M159 Polyosteoarthritis, unspecified: Secondary | ICD-10-CM | POA: Diagnosis not present

## 2015-04-19 DIAGNOSIS — K219 Gastro-esophageal reflux disease without esophagitis: Secondary | ICD-10-CM

## 2015-04-19 DIAGNOSIS — D72829 Elevated white blood cell count, unspecified: Secondary | ICD-10-CM

## 2015-04-19 DIAGNOSIS — J690 Pneumonitis due to inhalation of food and vomit: Secondary | ICD-10-CM | POA: Diagnosis not present

## 2015-04-19 DIAGNOSIS — E1169 Type 2 diabetes mellitus with other specified complication: Secondary | ICD-10-CM

## 2015-04-21 DIAGNOSIS — E1169 Type 2 diabetes mellitus with other specified complication: Secondary | ICD-10-CM | POA: Insufficient documentation

## 2015-04-21 DIAGNOSIS — E669 Obesity, unspecified: Secondary | ICD-10-CM

## 2015-04-21 DIAGNOSIS — K219 Gastro-esophageal reflux disease without esophagitis: Secondary | ICD-10-CM | POA: Insufficient documentation

## 2015-04-21 DIAGNOSIS — D638 Anemia in other chronic diseases classified elsewhere: Secondary | ICD-10-CM | POA: Insufficient documentation

## 2015-04-21 NOTE — Progress Notes (Signed)
Patient ID: Sarah Orozco, female   DOB: Oct 18, 1927, 81 y.o.   MRN: [id number]     Facility: [facility]    PCP: Marinda Elk, MD  Code Status: DNR  Allergies  Allergen Reactions  . Adhesive [Tape] Other (See Comments)    Nursing home records...unknown side effect  . Percocet [Oxycodone-Acetaminophen] Other (See Comments)    From nursing home records, unknown side effect    Chief Complaint  Patient presents with  . New Admit To SNF     HPI:  81 y.o. patient is here for short term rehabilitation post hospital admission from 04/09/15-04/16/15 with acute respiratory failure and sepsis from aspiration pneumonia. She required iv fluids and antibiotics. She has PMH of chronic respiratory failure, CHF, HTN, DM, HLD.She is seen in her room today. She mentions that her breathing is stable with oxygen. She feels tired though and would like to rest.   Review of Systems:  Constitutional: Negative for fever, chills, diaphoresis.  HENT: Negative for headache, congestion, nasal discharge Respiratory: Negative for cough, wheezing.   Cardiovascular: Negative for chest pain, palpitations. Gastrointestinal: Negative for heartburn, nausea, vomiting, abdominal pain. Genitourinary: Negative for dysuria Neurological: positive for weakness   Past Medical History  Diagnosis Date  . Mitral valve insufficiency   . Diabetes mellitus without complication ([place])   . Sleep apnea   . Hyperlipidemia   . Hypertension   . History of breast cancer   . Anemia   . Urinary incontinence   . Osteoarthritis   . GERD (gastroesophageal reflux disease)   . History of uterine cancer   . Non-Hodgkin's lymphoma ([place])   . Incontinence    Past Surgical History  Procedure Laterality Date  . Total abdominal hysterectomy w/ bilateral salpingoophorectomy    . Breast lumpectomy      left   . Tonsillectomy    . Appendectomy    . Knee replacement  03/17/2000    right knee   .  Carpal tunnel release  03/17/2000  . Endoscopic insertion peritoneal catheter port  2003  . Cardiac catheterization     Social History:   reports that she has never smoked. She does not have any smokeless tobacco history on file. She reports that she does not drink alcohol or use illicit drugs.  Family History  Problem Relation Age of Onset  . Hypertension Other     Medications:   Medication List       This list is accurate as of: 04/19/15 11:59 PM.  Always use your most recent med list.               acetaminophen 325 MG tablet  Commonly known as:  TYLENOL  Take 325 mg by mouth every 6 (six) hours as needed.     ALPRAZolam 0.25 MG tablet  Commonly known as:  XANAX  Take 0.25 mg by mouth 2 (two) times daily as needed for anxiety.     amoxicillin-clavulanate 875-125 MG tablet  Commonly known as:  AUGMENTIN  Take 1 tablet by mouth 2 (two) times daily.     CALCIUM 600+D 600-400 MG-UNIT tablet  Generic drug:  Calcium Carbonate-Vitamin D  Take 1 tablet by mouth 2 (two) times daily.     cloNIDine 0.1 MG tablet  Commonly known as:  CATAPRES  Take 1 tablet (0.1 mg total) by mouth 2 (two) times daily.     glipiZIDE 5 MG tablet  Commonly known as:  GLUCOTROL  Take 2.5  mg by mouth daily before breakfast.     hydrochlorothiazide 25 MG tablet  Commonly known as:  HYDRODIURIL  Take 1 tablet (25 mg total) by mouth daily.     losartan 50 MG tablet  Commonly known as:  COZAAR  Take 2 tablets (100 mg total) by mouth daily.     meloxicam 7.5 MG tablet  Commonly known as:  MOBIC  Take 7.5 mg by mouth daily.     multivitamin-lutein Caps capsule  Take 1 capsule by mouth daily.     nystatin 100000 UNIT/GM Powd  Apply 1-5 g topically 3 (three) times daily.     omeprazole 20 MG capsule  Commonly known as:  PRILOSEC  Take 20 mg by mouth daily.     sertraline 100 MG tablet  Commonly known as:  ZOLOFT  Take 150 mg by mouth daily.     T.E.D. BELOW KNEE/S-REGULAR Misc  by  Does not apply route.         Physical Exam: Filed Vitals:   04/19/15 1135  BP: 131/78  Pulse: 79  Temp: 97.6 F (36.4 C)  Resp: 18  SpO2: 98%    General- elderly female, obese, in no acute distress Head- normocephalic, atraumatic Nose- normal nasal mucosa, no maxillary or frontal sinus tenderness, no nasal discharge Throat- moist mucus membrane  Eyes- no pallor, no icterus, no discharge, normal conjunctiva, normal sclera Neck- no cervical lymphadenopathy Cardiovascular- normal s1,s2, no murmurs Respiratory- bilateral poor air entry, no wheeze, no rhonchi, no crackles, no use of accessory muscles Abdomen- bowel sounds present, soft, non tender Musculoskeletal- able to move all 4 extremities, generalized weakness, no leg edema Neurological- no focal deficit, alert and oriented to person Skin- warm and dry, groin rash + Psychiatry- normal mood and affect    Labs reviewed: Basic Metabolic Panel:  Recent Labs  04/11/15 0522 04/12/15 0650 04/14/15 0432  NA 142 140 144  K 4.1 3.7 3.6  CL 96* 92* 94*  CO2 38* 39* 40*  GLUCOSE 153* 161* 259*  BUN 18 17 34*  CREATININE 1.09* 0.91 0.93  CALCIUM 9.0 8.9 9.3   Liver Function Tests:  Recent Labs  04/09/15 0100  AST 20  ALT 17  ALKPHOS 55  BILITOT 1.0  PROT 6.1*  ALBUMIN 3.1*    Recent Labs  04/09/15 0100  LIPASE 20   No results for input(s): AMMONIA in the last 8760 hours. CBC:  Recent Labs  04/09/15 0100  04/12/15 0650 04/14/15 0432 04/15/15 0457  WBC 14.8*  < > 10.1 13.7* 14.6*  NEUTROABS 12.2*  --   --   --   --   HGB 9.9*  < > 10.5* 11.6* 11.4*  HCT 30.7*  < > 33.3* 35.8 36.3  MCV 80.5  < > 81.2 81.0 81.7  PLT 174  < > 207 223 226  < > = values in this interval not displayed. Cardiac Enzymes:  Recent Labs  04/09/15 0100  TROPONINI <0.03   BNP: Invalid input(s): POCBNP CBG:  Recent Labs  04/15/15 2129 04/16/15 0716 04/16/15 1119  GLUCAP 299* 186* 209*    Radiological  Exams: Dg Chest Port 1 View  04/09/2015  CLINICAL DATA:  Difficulty breathing, cough, and congestion and weakness for a few days. History of diabetes, hypertension, hyperlipidemia, breast cancer, non-Hodgkin's lymphoma. EXAM: PORTABLE CHEST 1 VIEW COMPARISON:  Chest radiograph March 16, 2014 FINDINGS: The cardiac silhouette is mildly enlarged, unchanged. Calcified tortuous aorta most often seen with hypertension. Pulmonary vascular congestion.  Slight blunting of costophrenic angles. No focal consolidation. No pneumothorax. Soft tissue planes and included osseous structures are nonsuspicious. Surgical clips project in LEFT axilla. Additional punctate foreign bodies projecting at the thoracic and are likely external to the patient. IMPRESSION: Mild cardiomegaly and pulmonary vascular congestion. Minimal pleural thickening, less likely pleural effusions. Electronically Signed   By: Elon Alas M.D.   On: 04/09/2015 01:58    Ct chest w contrast  04/12/15  IMPRESSION: Patchy left lower lobe opacity, suspicious for pneumonia. Additional mild patchy opacities in the right middle and lower lobes may reflect multifocal pneumonia (likely on the basis of aspiration) versus superimposed dependent atelectasis.    Assessment/Plan No problem-specific assessment & plan notes found for this encounter.  Physical deconditioning Will have her work with physical therapy and occupational therapy team to help with gait training and muscle strengthening exercises.fall precautions. Skin care. Encourage to be out of bed. If no improvement with therapy, consider palliative care consult after family meeting. Check tsh  Multifocal pneumonia Thought to be in setting of aspiration. Need aspiration precautions, high aspiration risk. To work with SLP team. Continue and complete her course of augmentin on 04/19/15  Acute on chronic respiratory failure Has hx of chf, OSA. Recent pneumonia exacerbated her respiatory  failure. Continue o2 for now, complete antibiotic course and take aspiration precautions  Leukocytosis Currently on treatment for pneumonia. Her pneumonia likely is contributing to the elevated wbc,currently afebrile. Monitor cbc with diff. Complete antibiotic course  Anemia of chronic disease Monitor cbc for now  HTN Monitor BP. Continue clonidine 0.1 mg bid, hctz 25 mg daily and losartan 100 mg daily. Check bmp  gerd Stable, continue prilosec 20 mg daily  Groin rash Continue skin care and nystatin powder  DM Lab Results  Component Value Date   HGBA1C 7.4* 07/03/2014  monitor cbg, continue glipizide 2.5 mg daily for now  OA Continue meloxicam and ca-vit d   Goals of care: short term rehabilitation, possible long term care   Labs/tests ordered: cbc with diff, cmp, tsh 04/20/15  Family/ staff Communication: reviewed care plan with patient and nursing supervisor    Blanchie Serve, MD  [facility] [phone] (Monday-Friday 8 am - 5 pm) [phone] (afterhours)

## 2015-04-24 ENCOUNTER — Non-Acute Institutional Stay (SKILLED_NURSING_FACILITY): Payer: Medicare Other | Admitting: Internal Medicine

## 2015-04-24 DIAGNOSIS — E669 Obesity, unspecified: Secondary | ICD-10-CM | POA: Diagnosis not present

## 2015-04-24 DIAGNOSIS — E1169 Type 2 diabetes mellitus with other specified complication: Secondary | ICD-10-CM

## 2015-04-24 DIAGNOSIS — M159 Polyosteoarthritis, unspecified: Secondary | ICD-10-CM | POA: Diagnosis not present

## 2015-04-24 DIAGNOSIS — E119 Type 2 diabetes mellitus without complications: Secondary | ICD-10-CM

## 2015-04-24 DIAGNOSIS — D72829 Elevated white blood cell count, unspecified: Secondary | ICD-10-CM | POA: Diagnosis not present

## 2015-04-24 DIAGNOSIS — R5381 Other malaise: Secondary | ICD-10-CM | POA: Diagnosis not present

## 2015-04-24 DIAGNOSIS — J9611 Chronic respiratory failure with hypoxia: Secondary | ICD-10-CM

## 2015-04-24 DIAGNOSIS — I10 Essential (primary) hypertension: Secondary | ICD-10-CM

## 2015-04-24 DIAGNOSIS — K219 Gastro-esophageal reflux disease without esophagitis: Secondary | ICD-10-CM

## 2015-04-24 NOTE — Progress Notes (Signed)
Patient ID: Sarah Orozco, female   DOB: Apr 28, 1928, 81 y.o.   MRN: [id number]     Facility: [facility]    PCP: Marinda Elk, MD  Code Status: DNR  Allergies  Allergen Reactions  . Adhesive [Tape] Other (See Comments)    Nursing home records...unknown side effect  . Percocet [Oxycodone-Acetaminophen] Other (See Comments)    From nursing home records, unknown side effect    Chief Complaint  Patient presents with  . Discharge Note     HPI:  81 y.o. patient is seen for discharge visit. She was here for STR post acute respiratory failure and sepsis with aspiration pneumonia. She has completed her antibiotic course. She is on oxygen. She has been working with therapy team minimally and refuses to participate in therapy. She is being discharged to another SNF. She is currently on dysphagia diet but her diet has been upgraded since her stay here and she is now on mechanical soft diet and thin liquids. She will need to continue to work with PT, OT and SLP at the other SNF. She is seen in her room today. She cusses me and asks me to leave the room and refuses to participate in HPI or ROS. She has PMH of chronic respiratory failure, CHF, HTN, DM, HLD.   Review of Systems:  Unable to obtain  Past Medical History  Diagnosis Date  . Mitral valve insufficiency   . Diabetes mellitus without complication ([place])   . Sleep apnea   . Hyperlipidemia   . Hypertension   . History of breast cancer   . Anemia   . Urinary incontinence   . Osteoarthritis   . GERD (gastroesophageal reflux disease)   . History of uterine cancer   . Non-Hodgkin's lymphoma ([place])   . Incontinence    Past Surgical History  Procedure Laterality Date  . Total abdominal hysterectomy w/ bilateral salpingoophorectomy    . Breast lumpectomy      left   . Tonsillectomy    . Appendectomy    . Knee replacement  03/17/2000    right knee   . Carpal tunnel release  03/17/2000  .  Endoscopic insertion peritoneal catheter port  2003  . Cardiac catheterization      Medications:   Medication List       This list is accurate as of: 04/24/15  6:58 PM.  Always use your most recent med list.               acetaminophen 325 MG tablet  Commonly known as:  TYLENOL  Take 325 mg by mouth every 6 (six) hours as needed.     ALPRAZolam 0.25 MG tablet  Commonly known as:  XANAX  Take 0.25 mg by mouth 2 (two) times daily as needed for anxiety.     CALCIUM 600+D 600-400 MG-UNIT tablet  Generic drug:  Calcium Carbonate-Vitamin D  Take 1 tablet by mouth 2 (two) times daily.     cloNIDine 0.1 MG tablet  Commonly known as:  CATAPRES  Take 1 tablet (0.1 mg total) by mouth 2 (two) times daily.     glipiZIDE 5 MG tablet  Commonly known as:  GLUCOTROL  Take 2.5 mg by mouth daily before breakfast.     hydrochlorothiazide 25 MG tablet  Commonly known as:  HYDRODIURIL  Take 1 tablet (25 mg total) by mouth daily.     losartan 50 MG tablet  Commonly known as:  COZAAR  Take  2 tablets (100 mg total) by mouth daily.     meloxicam 7.5 MG tablet  Commonly known as:  MOBIC  Take 7.5 mg by mouth daily.     multivitamin-lutein Caps capsule  Take 1 capsule by mouth daily.     nystatin 100000 UNIT/GM Powd  Apply 1-5 g topically 3 (three) times daily.     omeprazole 20 MG capsule  Commonly known as:  PRILOSEC  Take 20 mg by mouth daily.     sertraline 100 MG tablet  Commonly known as:  ZOLOFT  Take 150 mg by mouth daily.     T.E.D. BELOW KNEE/S-REGULAR Misc  by Does not apply route.         Physical Exam: Filed Vitals:   04/24/15 1857  BP: 142/69  Pulse: 60  Temp: 98.3 F (36.8 C)  Resp: 16  SpO2: 95%    General- elderly female, obese, in no acute distress Head- normocephalic, atraumatic Nose-  no nasal discharge Respiratory- on o2 by nasal canula, no use of accessory muscles Musculoskeletal- able to move all 4 extremities   Labs reviewed: Basic  Metabolic Panel:  Recent Labs  04/11/15 0522 04/12/15 0650 04/14/15 0432  NA 142 140 144  K 4.1 3.7 3.6  CL 96* 92* 94*  CO2 38* 39* 40*  GLUCOSE 153* 161* 259*  BUN 18 17 34*  CREATININE 1.09* 0.91 0.93  CALCIUM 9.0 8.9 9.3   Liver Function Tests:  Recent Labs  04/09/15 0100  AST 20  ALT 17  ALKPHOS 55  BILITOT 1.0  PROT 6.1*  ALBUMIN 3.1*    Recent Labs  04/09/15 0100  LIPASE 20   No results for input(s): AMMONIA in the last 8760 hours. CBC:  Recent Labs  04/09/15 0100  04/12/15 0650 04/14/15 0432 04/15/15 0457  WBC 14.8*  < > 10.1 13.7* 14.6*  NEUTROABS 12.2*  --   --   --   --   HGB 9.9*  < > 10.5* 11.6* 11.4*  HCT 30.7*  < > 33.3* 35.8 36.3  MCV 80.5  < > 81.2 81.0 81.7  PLT 174  < > 207 223 226  < > = values in this interval not displayed.  04/20/15 wbc 15.4, hb 10.5, hct 36.3, plt 229, na 139, k 4.2, bun 24, cr 0.76, lft wnl   Radiological Exams: Dg Chest Port 1 View  04/09/2015  CLINICAL DATA:  Difficulty breathing, cough, and congestion and weakness for a few days. History of diabetes, hypertension, hyperlipidemia, breast cancer, non-Hodgkin's lymphoma. EXAM: PORTABLE CHEST 1 VIEW COMPARISON:  Chest radiograph March 16, 2014 FINDINGS: The cardiac silhouette is mildly enlarged, unchanged. Calcified tortuous aorta most often seen with hypertension. Pulmonary vascular congestion. Slight blunting of costophrenic angles. No focal consolidation. No pneumothorax. Soft tissue planes and included osseous structures are nonsuspicious. Surgical clips project in LEFT axilla. Additional punctate foreign bodies projecting at the thoracic and are likely external to the patient. IMPRESSION: Mild cardiomegaly and pulmonary vascular congestion. Minimal pleural thickening, less likely pleural effusions. Electronically Signed   By: Elon Alas M.D.   On: 04/09/2015 01:58    Ct chest w contrast  04/12/15  IMPRESSION: Patchy left lower lobe opacity,  suspicious for pneumonia. Additional mild patchy opacities in the right middle and lower lobes may reflect multifocal pneumonia (likely on the basis of aspiration) versus superimposed dependent atelectasis.    Assessment/Plan  Patient is stable to be discharged to another SNF. She will need to be on  o2. It willbenefit her if she works with therapy team as she remains deconditioned.   Physical deconditioning Will have her work with physical therapy and occupational therapy team to help with gait training and muscle strengthening exercises if patient allows.   chronic respiratory failure Has hx of chf, OSA. Recent pneumonia exacerbated her respiatory failure. Continue o2 for now  Leukocytosis Completed antibiotics. Afebrile. This result is from couple of days back. Will need cbc with diff f/u in SNF  HTN Monitor BP. Continue clonidine 0.1 mg bid, hctz 25 mg daily and losartan 100 mg daily. Check bmp periodically  gerd Stable, continue prilosec 20 mg daily  DM monitor cbg, continue glipizide 2.5 mg daily for now  OA Continue meloxicam and ca-vit d    Blanchie Serve, MD  [facility] [phone] (Monday-Friday 8 am - 5 pm) [phone] (afterhours)

## 2015-06-01 ENCOUNTER — Inpatient Hospital Stay
Admission: EM | Admit: 2015-06-01 | Discharge: 2015-06-04 | DRG: 871 | Disposition: A | Discharge status: Hospice | Payer: Medicare Other | Attending: Internal Medicine | Admitting: Internal Medicine

## 2015-06-01 ENCOUNTER — Emergency Department: Payer: Medicare Other

## 2015-06-01 ENCOUNTER — Encounter: Payer: Self-pay | Admitting: Emergency Medicine

## 2015-06-01 DIAGNOSIS — R0682 Tachypnea, not elsewhere classified: Secondary | ICD-10-CM | POA: Diagnosis present

## 2015-06-01 DIAGNOSIS — Z9889 Other specified postprocedural states: Secondary | ICD-10-CM

## 2015-06-01 DIAGNOSIS — E119 Type 2 diabetes mellitus without complications: Secondary | ICD-10-CM | POA: Diagnosis present

## 2015-06-01 DIAGNOSIS — Z515 Encounter for palliative care: Secondary | ICD-10-CM | POA: Diagnosis present

## 2015-06-01 DIAGNOSIS — L89152 Pressure ulcer of sacral region, stage 2: Secondary | ICD-10-CM | POA: Diagnosis present

## 2015-06-01 DIAGNOSIS — Z8249 Family history of ischemic heart disease and other diseases of the circulatory system: Secondary | ICD-10-CM | POA: Diagnosis not present

## 2015-06-01 DIAGNOSIS — F329 Major depressive disorder, single episode, unspecified: Secondary | ICD-10-CM | POA: Diagnosis present

## 2015-06-01 DIAGNOSIS — E876 Hypokalemia: Secondary | ICD-10-CM | POA: Diagnosis present

## 2015-06-01 DIAGNOSIS — I340 Nonrheumatic mitral (valve) insufficiency: Secondary | ICD-10-CM | POA: Diagnosis present

## 2015-06-01 DIAGNOSIS — Z90710 Acquired absence of both cervix and uterus: Secondary | ICD-10-CM

## 2015-06-01 DIAGNOSIS — E785 Hyperlipidemia, unspecified: Secondary | ICD-10-CM | POA: Diagnosis present

## 2015-06-01 DIAGNOSIS — E870 Hyperosmolality and hypernatremia: Secondary | ICD-10-CM | POA: Diagnosis present

## 2015-06-01 DIAGNOSIS — Z9049 Acquired absence of other specified parts of digestive tract: Secondary | ICD-10-CM | POA: Diagnosis not present

## 2015-06-01 DIAGNOSIS — F419 Anxiety disorder, unspecified: Secondary | ICD-10-CM | POA: Diagnosis present

## 2015-06-01 DIAGNOSIS — Z888 Allergy status to other drugs, medicaments and biological substances status: Secondary | ICD-10-CM | POA: Diagnosis not present

## 2015-06-01 DIAGNOSIS — A419 Sepsis, unspecified organism: Principal | ICD-10-CM | POA: Diagnosis present

## 2015-06-01 DIAGNOSIS — R6521 Severe sepsis with septic shock: Secondary | ICD-10-CM | POA: Diagnosis present

## 2015-06-01 DIAGNOSIS — Z853 Personal history of malignant neoplasm of breast: Secondary | ICD-10-CM

## 2015-06-01 DIAGNOSIS — Z90722 Acquired absence of ovaries, bilateral: Secondary | ICD-10-CM | POA: Diagnosis not present

## 2015-06-01 DIAGNOSIS — G4730 Sleep apnea, unspecified: Secondary | ICD-10-CM | POA: Diagnosis present

## 2015-06-01 DIAGNOSIS — Z96659 Presence of unspecified artificial knee joint: Secondary | ICD-10-CM | POA: Diagnosis present

## 2015-06-01 DIAGNOSIS — I10 Essential (primary) hypertension: Secondary | ICD-10-CM | POA: Diagnosis present

## 2015-06-01 DIAGNOSIS — M1990 Unspecified osteoarthritis, unspecified site: Secondary | ICD-10-CM | POA: Diagnosis present

## 2015-06-01 DIAGNOSIS — Z8572 Personal history of non-Hodgkin lymphomas: Secondary | ICD-10-CM | POA: Diagnosis not present

## 2015-06-01 DIAGNOSIS — J690 Pneumonitis due to inhalation of food and vomit: Secondary | ICD-10-CM | POA: Diagnosis not present

## 2015-06-01 DIAGNOSIS — Z66 Do not resuscitate: Secondary | ICD-10-CM | POA: Diagnosis present

## 2015-06-01 DIAGNOSIS — Z885 Allergy status to narcotic agent status: Secondary | ICD-10-CM | POA: Diagnosis not present

## 2015-06-01 DIAGNOSIS — Z79899 Other long term (current) drug therapy: Secondary | ICD-10-CM

## 2015-06-01 DIAGNOSIS — K219 Gastro-esophageal reflux disease without esophagitis: Secondary | ICD-10-CM | POA: Diagnosis present

## 2015-06-01 DIAGNOSIS — N179 Acute kidney failure, unspecified: Secondary | ICD-10-CM | POA: Diagnosis present

## 2015-06-01 DIAGNOSIS — Z8701 Personal history of pneumonia (recurrent): Secondary | ICD-10-CM

## 2015-06-01 DIAGNOSIS — L8990 Pressure ulcer of unspecified site, unspecified stage: Secondary | ICD-10-CM | POA: Insufficient documentation

## 2015-06-01 DIAGNOSIS — Z8542 Personal history of malignant neoplasm of other parts of uterus: Secondary | ICD-10-CM | POA: Diagnosis not present

## 2015-06-01 DIAGNOSIS — F0390 Unspecified dementia without behavioral disturbance: Secondary | ICD-10-CM | POA: Diagnosis present

## 2015-06-01 DIAGNOSIS — R1310 Dysphagia, unspecified: Secondary | ICD-10-CM | POA: Diagnosis present

## 2015-06-01 LAB — COMPREHENSIVE METABOLIC PANEL
ALK PHOS: 89 U/L (ref 38–126)
ALT: 26 U/L (ref 14–54)
ANION GAP: 12 (ref 5–15)
AST: 22 U/L (ref 15–41)
Albumin: 3.5 g/dL (ref 3.5–5.0)
BILIRUBIN TOTAL: 1.4 mg/dL — AB (ref 0.3–1.2)
BUN: 45 mg/dL — ABNORMAL HIGH (ref 6–20)
CALCIUM: 9.6 mg/dL (ref 8.9–10.3)
CO2: 35 mmol/L — AB (ref 22–32)
CREATININE: 1.07 mg/dL — AB (ref 0.44–1.00)
Chloride: 109 mmol/L (ref 101–111)
GFR calc non Af Amer: 46 mL/min — ABNORMAL LOW (ref >60)
GFR, EST AFRICAN AMERICAN: 53 mL/min — AB (ref >60)
Glucose, Bld: 192 mg/dL — ABNORMAL HIGH (ref 65–99)
Potassium: 2.9 mmol/L — CL (ref 3.5–5.1)
SODIUM: 156 mmol/L — AB (ref 135–145)
TOTAL PROTEIN: 7 g/dL (ref 6.5–8.1)

## 2015-06-01 LAB — CBC
HEMATOCRIT: 45.1 % (ref 35.0–47.0)
HEMOGLOBIN: 14.1 g/dL (ref 12.0–16.0)
MCH: 26 pg (ref 26.0–34.0)
MCHC: 31.3 g/dL — AB (ref 32.0–36.0)
MCV: 82.9 fL (ref 80.0–100.0)
Platelets: 210 10*3/uL (ref 150–440)
RBC: 5.43 MIL/uL — AB (ref 3.80–5.20)
RDW: 17.7 % — ABNORMAL HIGH (ref 11.5–14.5)
WBC: 14.3 10*3/uL — ABNORMAL HIGH (ref 3.6–11.0)

## 2015-06-01 LAB — GLUCOSE, CAPILLARY: GLUCOSE-CAPILLARY: 179 mg/dL — AB (ref 65–99)

## 2015-06-01 LAB — TROPONIN I
TROPONIN I: 0.07 ng/mL — AB (ref <0.031)
Troponin I: 0.07 ng/mL — ABNORMAL HIGH (ref <0.031)

## 2015-06-01 MED ORDER — PIPERACILLIN-TAZOBACTAM 3.375 G IVPB
3.3750 g | Freq: Three times a day (TID) | INTRAVENOUS | Status: DC
  Filled 2015-06-01 (×3): qty 50

## 2015-06-01 MED ORDER — MORPHINE SULFATE (PF) 2 MG/ML IV SOLN
INTRAVENOUS | Status: AC
  Administered 2015-06-01: 2 mg via INTRAVENOUS
  Filled 2015-06-01: qty 1

## 2015-06-01 MED ORDER — KCL IN DEXTROSE-NACL 20-5-0.45 MEQ/L-%-% IV SOLN
INTRAVENOUS | Status: DC
Start: 2015-06-01 — End: 2015-06-02
  Administered 2015-06-02: via INTRAVENOUS
  Filled 2015-06-01 (×4): qty 1000

## 2015-06-01 MED ORDER — CALCIUM CARBONATE-VITAMIN D 500-200 MG-UNIT PO TABS
1.0000 | ORAL_TABLET | Freq: Two times a day (BID) | ORAL | Status: DC
  Filled 2015-06-01: qty 1

## 2015-06-01 MED ORDER — PANTOPRAZOLE SODIUM 40 MG PO TBEC: 40.0000 mg | DELAYED_RELEASE_TABLET | Freq: Every day | ORAL | Status: DC

## 2015-06-01 MED ORDER — ACETAMINOPHEN 325 MG PO TABS: 650.0000 mg | ORAL_TABLET | Freq: Four times a day (QID) | ORAL | Status: DC | PRN

## 2015-06-01 MED ORDER — MORPHINE SULFATE (PF) 2 MG/ML IV SOLN
2.0000 mg | Freq: Once | INTRAVENOUS | Status: AC
Start: 2015-06-01 — End: 2015-06-01
  Administered 2015-06-01: 2 mg via INTRAVENOUS

## 2015-06-01 MED ORDER — INSULIN ASPART 100 UNIT/ML ~~LOC~~ SOLN
0.0000 [IU] | Freq: Every day | SUBCUTANEOUS | Status: DC
  Filled 2015-06-01: qty 3
  Filled 2015-06-01: qty 2

## 2015-06-01 MED ORDER — NYSTATIN 100000 UNIT/GM EX POWD
1.0000 g | Freq: Three times a day (TID) | CUTANEOUS | Status: DC
  Administered 2015-06-01 – 2015-06-02 (×2): 2 g via TOPICAL
  Administered 2015-06-02 (×2): 5 g via TOPICAL
  Administered 2015-06-03 (×2): 1 g via TOPICAL
  Administered 2015-06-03: 20:00:00 3 g via TOPICAL
  Administered 2015-06-04: 09:00:00 1 g via TOPICAL
  Filled 2015-06-01: qty 15

## 2015-06-01 MED ORDER — ONDANSETRON HCL 4 MG PO TABS: 4.0000 mg | ORAL_TABLET | Freq: Four times a day (QID) | ORAL | Status: DC | PRN

## 2015-06-01 MED ORDER — ONDANSETRON HCL 4 MG/2ML IJ SOLN: 4.0000 mg | Freq: Four times a day (QID) | INTRAMUSCULAR | Status: DC | PRN

## 2015-06-01 MED ORDER — CLONIDINE HCL 0.1 MG PO TABS
0.1000 mg | ORAL_TABLET | Freq: Two times a day (BID) | ORAL | Status: DC
  Filled 2015-06-01: qty 1

## 2015-06-01 MED ORDER — POTASSIUM CHLORIDE 10 MEQ/100ML IV SOLN
10.0000 meq | INTRAVENOUS | Status: AC
  Filled 2015-06-01 (×4): qty 100

## 2015-06-01 MED ORDER — OCUVITE-LUTEIN PO CAPS
1.0000 | ORAL_CAPSULE | Freq: Every day | ORAL | Status: DC
  Filled 2015-06-01: qty 1

## 2015-06-01 MED ORDER — GLIPIZIDE 5 MG PO TABS: 2.5000 mg | ORAL_TABLET | Freq: Every day | ORAL | Status: DC

## 2015-06-01 MED ORDER — VANCOMYCIN HCL IN DEXTROSE 750-5 MG/150ML-% IV SOLN
750.0000 mg | Freq: Once | INTRAVENOUS | Status: AC
  Administered 2015-06-02: 750 mg via INTRAVENOUS
  Filled 2015-06-01: qty 150

## 2015-06-01 MED ORDER — INSULIN ASPART 100 UNIT/ML ~~LOC~~ SOLN
0.0000 [IU] | Freq: Three times a day (TID) | SUBCUTANEOUS | Status: DC
Start: 2015-06-02 — End: 2015-06-03
  Administered 2015-06-02: 2 [IU] via SUBCUTANEOUS
  Administered 2015-06-02: 09:00:00 3 [IU] via SUBCUTANEOUS
  Administered 2015-06-02 – 2015-06-03 (×2): 2 [IU] via SUBCUTANEOUS
  Filled 2015-06-01 (×2): qty 2

## 2015-06-01 MED ORDER — LOSARTAN POTASSIUM 50 MG PO TABS: 100.0000 mg | ORAL_TABLET | Freq: Every day | ORAL | Status: DC

## 2015-06-01 MED ORDER — SERTRALINE HCL 50 MG PO TABS: 150.0000 mg | ORAL_TABLET | Freq: Every day | ORAL | Status: DC

## 2015-06-01 MED ORDER — VANCOMYCIN HCL IN DEXTROSE 750-5 MG/150ML-% IV SOLN
750.0000 mg | Freq: Once | INTRAVENOUS | Status: DC
  Filled 2015-06-01: qty 150

## 2015-06-01 MED ORDER — ENOXAPARIN SODIUM 40 MG/0.4ML ~~LOC~~ SOLN
40.0000 mg | SUBCUTANEOUS | Status: DC
  Administered 2015-06-01: 40 mg via SUBCUTANEOUS
  Filled 2015-06-01: qty 0.4

## 2015-06-01 MED ORDER — ALPRAZOLAM 0.25 MG PO TABS: 0.2500 mg | ORAL_TABLET | Freq: Two times a day (BID) | ORAL | Status: DC | PRN

## 2015-06-01 MED ORDER — VANCOMYCIN HCL IN DEXTROSE 750-5 MG/150ML-% IV SOLN
750.0000 mg | INTRAVENOUS | Status: DC
  Filled 2015-06-01: qty 150

## 2015-06-01 MED ORDER — CLINDAMYCIN PHOSPHATE 600 MG/50ML IV SOLN
600.0000 mg | Freq: Once | INTRAVENOUS | Status: AC
  Administered 2015-06-01: 600 mg via INTRAVENOUS
  Filled 2015-06-01: qty 50

## 2015-06-01 MED ORDER — PIPERACILLIN-TAZOBACTAM 3.375 G IVPB
3.3750 g | Freq: Three times a day (TID) | INTRAVENOUS | Status: DC
  Administered 2015-06-01 – 2015-06-03 (×5): 3.375 g via INTRAVENOUS
  Filled 2015-06-01 (×8): qty 50

## 2015-06-01 MED ORDER — VANCOMYCIN HCL IN DEXTROSE 750-5 MG/150ML-% IV SOLN
750.0000 mg | INTRAVENOUS | Status: DC
  Administered 2015-06-02: 750 mg via INTRAVENOUS
  Filled 2015-06-01: qty 150

## 2015-06-01 MED ORDER — MELOXICAM 7.5 MG PO TABS
7.5000 mg | ORAL_TABLET | Freq: Every day | ORAL | Status: DC
  Filled 2015-06-01 (×3): qty 1

## 2015-06-01 NOTE — ED Provider Notes (Signed)
[facility] Emergency Department Provider Note  Time seen: 5:31 PM  I have reviewed the triage vital signs and the nursing notes.   HISTORY  Chief Complaint Shortness of Breath    HPI Sarah Orozco is a 81 y.o. female with a past medical history of diabetes, hyperlipidemia, hypertension, anemia, gastric reflux, dementia, who presents the emergency department from [facility] facility for concerns of possible aspiration.  According to the daughter the patient has a history of aspiration pneumonia, most recently hospitalized at the end of November. Since that discharge the patient has been at [facility], on oxygen. Over the past 3-4 days the patient has been coughing, and they have been suctioning a significant amount of sputum/mucus from the patient. They state slight increase in the patient's confusion, but has a baseline history of dementia. Patient cannot give an adequate history due to her dementia.     Past Medical History  Diagnosis Date  . Mitral valve insufficiency   . Diabetes mellitus without complication ([place])   . Sleep apnea   . Hyperlipidemia   . Hypertension   . History of breast cancer   . Anemia   . Urinary incontinence   . Osteoarthritis   . GERD (gastroesophageal reflux disease)   . History of uterine cancer   . Non-Hodgkin's lymphoma ([place])   . Incontinence     Patient Active Problem List   Diagnosis Date Noted  . Diabetes mellitus type 2 in obese ([place]) 04/21/2015  . Anemia of chronic disease 04/21/2015  . Esophageal reflux 04/21/2015  . Sepsis ([place]) 04/09/2015  . SOB (shortness of breath) 07/03/2014  . Essential hypertension 07/03/2014  . Morbid obesity ([place]) 07/03/2014  . Poorly controlled type 2 diabetes mellitus ([place]) 07/03/2014  . Incontinence 07/03/2014  . Obstructive sleep apnea 07/03/2014  . Chronic diastolic CHF (congestive heart failure) ([place]) 07/03/2014    Past Surgical History  Procedure  Laterality Date  . Total abdominal hysterectomy w/ bilateral salpingoophorectomy    . Breast lumpectomy      left   . Tonsillectomy    . Appendectomy    . Knee replacement  03/17/2000    right knee   . Carpal tunnel release  03/17/2000  . Endoscopic insertion peritoneal catheter port  2003  . Cardiac catheterization      Current Outpatient Rx  Name  Route  Sig  Dispense  Refill  . acetaminophen (TYLENOL) 325 MG tablet   Oral   Take 325 mg by mouth every 6 (six) hours as needed.          . ALPRAZolam (XANAX) 0.25 MG tablet   Oral   Take 0.25 mg by mouth 2 (two) times daily as needed for anxiety.         . Calcium Carbonate-Vitamin D (CALCIUM 600+D) 600-400 MG-UNIT per tablet   Oral   Take 1 tablet by mouth 2 (two) times daily.         . cloNIDine (CATAPRES) 0.1 MG tablet   Oral   Take 1 tablet (0.1 mg total) by mouth 2 (two) times daily.   60 tablet   3   . Elastic Bandages & Supports (T.E.D. BELOW KNEE/S-REGULAR) MISC   Does not apply   by Does not apply route.         Marland Kitchen glipiZIDE (GLUCOTROL) 5 MG tablet   Oral   Take 2.5 mg by mouth daily before breakfast.         .  hydrochlorothiazide (HYDRODIURIL) 25 MG tablet   Oral   Take 1 tablet (25 mg total) by mouth daily.   30 tablet   11   . losartan (COZAAR) 50 MG tablet   Oral   Take 2 tablets (100 mg total) by mouth daily.   60 tablet   6   . meloxicam (MOBIC) 7.5 MG tablet   Oral   Take 7.5 mg by mouth daily.         . multivitamin-lutein (OCUVITE-LUTEIN) CAPS capsule   Oral   Take 1 capsule by mouth daily.         Marland Kitchen nystatin (MYCOSTATIN/NYSTOP) 100000 UNIT/GM POWD   Topical   Apply 1-5 g topically 3 (three) times daily.         Marland Kitchen omeprazole (PRILOSEC) 20 MG capsule   Oral   Take 20 mg by mouth daily.         . sertraline (ZOLOFT) 100 MG tablet   Oral   Take 150 mg by mouth daily.            Allergies Adhesive and Percocet  Family History  Problem Relation Age of Onset   . Hypertension Other     Social History Social History  Substance Use Topics  . Smoking status: Never Smoker   . Smokeless tobacco: Not on file  . Alcohol Use: No    Review of Systems Unable to obtain an adequate review of systems due to dementia.  ____________________________________________   PHYSICAL EXAM:  VITAL SIGNS: ED Triage Vitals  Enc Vitals Group     BP 06/01/15 1714 140/70 mmHg     Pulse Rate 06/01/15 1714 88     Resp 06/01/15 1714 18     Temp 06/01/15 1714 97.4 F (36.3 C)     Temp Source 06/01/15 1714 Oral     SpO2 06/01/15 1714 96 %     Weight 06/01/15 1714 158 lb (71.668 kg)     Height 06/01/15 1714 5\' 2"  (1.575 m)     Head Cir --      Peak Flow --      Pain Score 06/01/15 1716 4     Pain Loc --      Pain Edu? --      Excl. in [place]? --     Constitutional: Alert.  No acute distress. Eyes: Normal exam ENT   Head: Normocephalic and atraumatic.   Mouth/Throat: Mucous membranes are moist. Cardiovascular: Normal rate, regular rhythm. No murmur Respiratory: Normal respiratory effort, normal respiratory rate. Mild crackles bilaterally versus upper airway secretions. Gastrointestinal: Soft and nontender. No distention. Neurologic: Normal speech, daughter states baseline neurologic status. Skin:  Skin is warm, dry and intact.  Psychiatric: Dementia, irritated at times.    EKG  EKG reviewed and interpreted by myself shows sinus tachycardia 101 bpm, one QRS, left axis deviation, nonspecific ST changes.  ____________________________________________    RADIOLOGY  CT head shows no acute abnormality. Chest x-ray shows bibasilar opacities.  ____________________________________________    INITIAL IMPRESSION / ASSESSMENT AND PLAN / ED COURSE  Pertinent labs & imaging results that were available during my care of the patient were reviewed by me and considered in my medical decision making (see chart for details).  Patient presents to the  emergency department by her nursing home with concerns of possible aspiration pneumonia. We will check labs, chest x-ray. They also state slightly increase in the patient's baseline confusion, we will obtain a CT head as well.  Patient's  labs have resulted in elevated white blood cell count. Patient's x-ray shows bilateral basilar opacities, infiltrate versus atelectasis. Given the patient's elevated white blood cell count, elevated troponin, recent cough with significant mucus production over the past 3 days after a possible aspiration event (granddaughter was feeding the patient non-thickened foods), we will place the patient on clindamycin, send blood cultures, and admit to the hospital.  I discussed this plan of care with the patient's daughter.   ____________________________________________   FINAL CLINICAL IMPRESSION(S) / ED DIAGNOSES  Cough Aspiration pneumonia  Harvest Dark, MD 06/01/15 [id number]

## 2015-06-01 NOTE — Progress Notes (Signed)
ANTIBIOTIC CONSULT NOTE - INITIAL  Pharmacy Consult for Vancomycin and Zosyn Indication: pneumonia  Allergies  Allergen Reactions  . Adhesive [Tape] Other (See Comments)    Nursing home records...unknown side effect  . Percocet [Oxycodone-Acetaminophen] Other (See Comments)    From nursing home records, unknown side effect    Patient Measurements: Height: 5\' 2"  (157.5 cm) Weight: 158 lb (71.668 kg) IBW/kg (Calculated) : 50.1 Adjusted Body Weight: 59 kg  Vital Signs: Temp: 97.4 F (36.3 C) (12/30 1714) Temp Source: Oral (12/30 1714) BP: 133/74 mmHg (12/30 1900) Pulse Rate: 101 (12/30 1900)  Recent Labs  06/01/15 1729  WBC 14.3*  HGB 14.1  PLT 210  CREATININE 1.07*   Estimated Creatinine Clearance: 35.6 mL/min (by C-G formula based on Cr of 1.07). No results for input(s): VANCOTROUGH, VANCOPEAK, VANCORANDOM, GENTTROUGH, GENTPEAK, GENTRANDOM, TOBRATROUGH, TOBRAPEAK, TOBRARND, AMIKACINPEAK, AMIKACINTROU, AMIKACIN in the last 72 hours.   Microbiology: No results found for this or any previous visit (from the past 720 hour(s)).  Medical History: Past Medical History  Diagnosis Date  . Mitral valve insufficiency   . Diabetes mellitus without complication ([place])   . Sleep apnea   . Hyperlipidemia   . Hypertension   . History of breast cancer   . Anemia   . Urinary incontinence   . Osteoarthritis   . GERD (gastroesophageal reflux disease)   . History of uterine cancer   . Non-Hodgkin's lymphoma ([place])   . Incontinence    Assessment: 81 yo female brought from assisted living facility with confusion and cough for past 3-4 days. Patient has had increased sputum and mucous requiring suctioning according to care giver.  Patient is suspected to have possible aspiration PNA.   Pharmacy consulted for dosing and monitoring of vancomycin and zosyn.   **Patient has has previous admission to hospital on 04/16/15 for Sepsis/Aspiration PNA.   Goal of Therapy:  Vancomycin  trough level 15-20 mcg/ml  Plan:  Ke:0.034    Vd: 41.3    T1/2  20  Will start the patient on vancomycin 750mg  every 24 hours with 6 hour stack dosing. Calculated trough at Css is 16. Trough level ordered prior to 5th dose.   Will start patient on Zosyn 3.375 IV EI q8h.   Pharmacy will continue to monitor renal function and labs and make adjustments as needed.   Nancy Fetter, PharmD Pharmacy Resident  06/01/2015,7:14 PM

## 2015-06-01 NOTE — ED Notes (Signed)
Pt here via ems from [facility] for possible aspiration pneumonia. Pt has been refusing her medications per EMS and has been holding liquids in the back of her mouth.

## 2015-06-01 NOTE — H&P (Signed)
[facility] [place] NAME: Sarah Orozco    MR#:  [id number]  DATE OF BIRTH:  1927-09-24  DATE OF ADMISSION:  06/01/2015  PRIMARY CARE PHYSICIAN: Kirk Ruths., MD   REQUESTING/REFERRING PHYSICIAN: Dr. Harvest Dark  CHIEF COMPLAINT:   Chief Complaint  Patient presents with  . Shortness of Breath    HISTORY OF PRESENT ILLNESS:  Sarah Orozco  is a 81 y.o. female with a known history of dementia, aspiration pneumonia history on dysphagia diet, GERD, hypertension, sleep apnea and diabetes presents from [facility] facility secondary to increased secretions and also difficulty breathing. Patient is not a great historian due to her underlying dementia, most of the history is obtained from her friend/power of attorney at bedside. Patient was in the hospital last month for aspiration pneumonia and was discharged on a dysphagia diet. According to her POA, patient's niece visited her over the weekend and started giving her ginger ale and ice cream per patient's request. For the last couple of days the staff at the nursing home have noticed that patient was having increased excretions and they started suctioning. Apparently an x-ray was supposed to be done which was not done according to the family member. Patient is presenting with elevated white count, some tachypnea and increased clearing of her throat. Chest x-ray with bibasilar infiltrates noted. She was discharged on 2 L oxygen the last time she was here and is now stable on 2 L. Patient's POA does not want her to go back to [facility].  PAST MEDICAL HISTORY:   Past Medical History  Diagnosis Date  . Mitral valve insufficiency   . Diabetes mellitus without complication ([place])   . Sleep apnea   . Hyperlipidemia   . Hypertension   . History of breast cancer   . Anemia   . Urinary incontinence   . Osteoarthritis   . GERD (gastroesophageal reflux  disease)   . History of uterine cancer   . Non-Hodgkin's lymphoma ([place])   . Incontinence     PAST SURGICAL HISTORY:   Past Surgical History  Procedure Laterality Date  . Total abdominal hysterectomy w/ bilateral salpingoophorectomy    . Breast lumpectomy      left   . Tonsillectomy    . Appendectomy    . Knee replacement  03/17/2000    right knee   . Carpal tunnel release  03/17/2000  . Endoscopic insertion peritoneal catheter port  2003  . Cardiac catheterization      SOCIAL HISTORY:   Social History  Substance Use Topics  . Smoking status: Never Smoker   . Smokeless tobacco: Not on file  . Alcohol Use: No    FAMILY HISTORY:   Family History  Problem Relation Age of Onset  . Hypertension Other     DRUG ALLERGIES:   Allergies  Allergen Reactions  . Adhesive [Tape] Other (See Comments)    Nursing home records...unknown side effect  . Percocet [Oxycodone-Acetaminophen] Other (See Comments)    From nursing home records, unknown side effect    REVIEW OF SYSTEMS:   Review of Systems  Constitutional: Positive for chills and malaise/fatigue. Negative for fever and weight loss.  HENT: Negative for ear discharge, ear pain, nosebleeds and tinnitus.   Eyes: Negative for blurred vision, double vision and photophobia.  Respiratory: Positive for cough, sputum production and shortness of breath. Negative for hemoptysis and wheezing.   Cardiovascular: Negative for chest pain, palpitations, orthopnea  and leg swelling.  Gastrointestinal: Negative for nausea, vomiting, abdominal pain, diarrhea, constipation and melena.  Genitourinary: Negative for dysuria and urgency.       Incontinent  Musculoskeletal: Positive for myalgias. Negative for back pain and neck pain.  Skin: Negative for rash.  Neurological: Negative for dizziness, tremors, sensory change, speech change, focal weakness and headaches.  Endo/Heme/Allergies: Does not bruise/bleed easily.  Psychiatric/Behavioral:  Negative for depression.    MEDICATIONS AT HOME:   Prior to Admission medications   Medication Sig Start Date End Date Taking? Authorizing Provider  acetaminophen (TYLENOL) 325 MG tablet Take 325 mg by mouth every 6 (six) hours as needed.  04/05/14   Historical Provider, MD  ALPRAZolam Duanne Moron) 0.25 MG tablet Take 0.25 mg by mouth 2 (two) times daily as needed for anxiety.    Historical Provider, MD  Calcium Carbonate-Vitamin D (CALCIUM 600+D) 600-400 MG-UNIT per tablet Take 1 tablet by mouth 2 (two) times daily.    Historical Provider, MD  cloNIDine (CATAPRES) 0.1 MG tablet Take 1 tablet (0.1 mg total) by mouth 2 (two) times daily. 03/21/15   Minna Merritts, MD  Elastic Bandages & Supports (T.E.D. BELOW KNEE/S-REGULAR) MISC by Does not apply route.    Historical Provider, MD  glipiZIDE (GLUCOTROL) 5 MG tablet Take 2.5 mg by mouth daily before breakfast.    Historical Provider, MD  hydrochlorothiazide (HYDRODIURIL) 25 MG tablet Take 1 tablet (25 mg total) by mouth daily. 07/03/14   Minna Merritts, MD  losartan (COZAAR) 50 MG tablet Take 2 tablets (100 mg total) by mouth daily. 11/16/14   Minna Merritts, MD  meloxicam (MOBIC) 7.5 MG tablet Take 7.5 mg by mouth daily.    Historical Provider, MD  multivitamin-lutein (OCUVITE-LUTEIN) CAPS capsule Take 1 capsule by mouth daily.    Historical Provider, MD  nystatin (MYCOSTATIN/NYSTOP) 100000 UNIT/GM POWD Apply 1-5 g topically 3 (three) times daily.    Historical Provider, MD  omeprazole (PRILOSEC) 20 MG capsule Take 20 mg by mouth daily.    Historical Provider, MD  sertraline (ZOLOFT) 100 MG tablet Take 150 mg by mouth daily.     Historical Provider, MD      VITAL SIGNS:  Blood pressure 133/74, pulse 101, temperature 97.4 F (36.3 C), temperature source Oral, resp. rate 20, height 5\' 2"  (1.575 m), weight 71.668 kg (158 lb), SpO2 94 %.  PHYSICAL EXAMINATION:   Physical Exam  GENERAL:  81 y.o.-year-old patient lying in the bed with no acute  distress.  EYES: Pupils equal, round, reactive to light and accommodation. No scleral icterus. Extraocular muscles intact.  HEENT: Head atraumatic, normocephalic. Oropharynx and nasopharynx clear.  NECK:  Supple, no jugular venous distention. No thyroid enlargement, no tenderness. Erythema noted in the neck fold on the right side where patient likes to lean.  LUNGS: Normal breath sounds bilaterally, no wheezing, rales,rhonchi or crepitation. No use of accessory muscles of respiration. Decreased bibasilar breath sounds. CARDIOVASCULAR: S1, S2 normal. No rubs, or gallops. 3/6 systolic murmur present. ABDOMEN: Soft, nontender, nondistended. Bowel sounds present. No organomegaly or mass.  EXTREMITIES: No pedal edema, cyanosis, or clubbing.  NEUROLOGIC: Cranial nerves II through XII are intact. Muscle strength 5/5 in all extremities. Sensation intact. Gait not checked.  PSYCHIATRIC: The patient is alert and oriented x 2.  SKIN: No obvious rash, lesion, or ulcer.   LABORATORY PANEL:   CBC  Recent Labs Lab 06/01/15 1729  WBC 14.3*  HGB 14.1  HCT 45.1  PLT 210   ------------------------------------------------------------------------------------------------------------------  Chemistries   Recent Labs Lab 06/01/15 1729  NA 156*  K 2.9*  CL 109  CO2 35*  GLUCOSE 192*  BUN 45*  CREATININE 1.07*  CALCIUM 9.6  AST 22  ALT 26  ALKPHOS 89  BILITOT 1.4*   ------------------------------------------------------------------------------------------------------------------  Cardiac Enzymes  Recent Labs Lab 06/01/15 1729  TROPONINI 0.07*   ------------------------------------------------------------------------------------------------------------------  RADIOLOGY:  Ct Head Wo Contrast  06/01/2015  CLINICAL DATA:  81 year old female with altered mental status and question left-sided weakness. EXAM: CT HEAD WITHOUT CONTRAST TECHNIQUE: Contiguous axial images were obtained from the  base of the skull through the vertex without intravenous contrast. COMPARISON:  CT dated 03/16/2014 FINDINGS: There is stable enlargement the ventricles and sulci compatible with age-related atrophy. Bifrontal cortical atrophy similar to prior study. New Periventricular and deep white matter hypodensities represent chronic microvascular ischemic changes. There is no intracranial hemorrhage. No mass effect or midline shift identified. The visualized paranasal sinuses and mastoid air cells are well aerated. The calvarium is intact. IMPRESSION: No acute intracranial hemorrhage. Age-related atrophy and chronic microvascular ischemic disease. If symptoms persist and there are no contraindications, MRI may provide better evaluation if clinically indicated. Electronically Signed   By: Anner Crete M.D.   On: 06/01/2015 18:21   Dg Chest Portable 1 View  06/01/2015  CLINICAL DATA:  Aspiration pneumonia.  Left-sided weakness. EXAM: PORTABLE CHEST 1 VIEW COMPARISON:  04/12/2015 FINDINGS: Tortuous and atherosclerotic thoracic aorta as on prior exams. Low lung volumes. Bandlike opacities at both lung bases favoring atelectasis. The lungs appear otherwise clear. IMPRESSION: 1. Bibasilar bandlike opacities favoring atelectasis. 2. Aortic atherosclerosis and tortuosity. Electronically Signed   By: Van Clines M.D.   On: 06/01/2015 17:55    EKG:   Orders placed or performed during the hospital encounter of 04/09/15  . EKG 12-Lead  . EKG 12-Lead    IMPRESSION AND PLAN:   Sarah Orozco  is a 81 y.o. female with a known history of dementia, aspiration pneumonia history on dysphagia diet, GERD, hypertension, sleep apnea and diabetes presents from [facility] facility secondary to increased secretions and also difficulty breathing.  #1 Aspiration pneumonia- recurrent. Likely from drinking thin liquids per POA given by patients niece over the last 4 days - CXR with bibasilar infiltrates -  Blood cultures. IV vanc and zosyn until cultures are negative- then ABX can be narrowed - NPO, repeat speech consult - meds in apple sauce - aspiration precautions - o2 support as needed- on 2L chronic o2  2 hypernatremia-secondary to free water deficit -D5 half normal saline started for now. Monitor.  #3 hypokalemia-being replaced and recheck in a.m.  #4 diabetes mellitus-continue glipizide. On sliding scale insulin as well. -Monitor especially while on D5 fluids.  #5 hypertension-restart Cozaar and clonidine  #6 depression-continue Zoloft. Also on Xanax when necessary for anxiety  #7 DVT prophylaxis-on Lovenox  Patients POA does not want the patient  to go back to [facility]. Patient has expressed some concerns about her POA (who is actually patient neighbor and not a family member) after the POA left the room about how the patient is scared to go back home with her. She is oriented x 3 to situation and self. But not sure if capable to make that decision Will have Psych eval for the same.    All the records are reviewed and case discussed with ED provider. Management plans discussed with the patient, family and they are in agreement.  CODE STATUS: DO NOT  RESUSCITATE  TOTAL TIME TAKING CARE OF THIS PATIENT: 50 minutes.    El Pile M.D on 06/01/2015 at 7:08 PM  Between 7am to 6pm - Pager - [phone]  After 6pm go to [email or web address] - password [email or web address] [facility]  Office  [phone]  CC: Primary care physician; Kirk Ruths., MD

## 2015-06-01 NOTE — Progress Notes (Signed)
ANTIBIOTIC CONSULT NOTE - INITIAL  Pharmacy Consult for Vancomycin/Zosyn  Indication: Aspiration PNA   Allergies  Allergen Reactions  . Adhesive [Tape] Other (See Comments)    Nursing home records...unknown side effect  . Percocet [Oxycodone-Acetaminophen] Other (See Comments)    From nursing home records, unknown side effect    Patient Measurements: Height: 5\' 2"  (157.5 cm) Weight: 158 lb (71.668 kg) IBW/kg (Calculated) : 50.1 Adjusted Body Weight: 41.1 L   Vital Signs: Temp: 98.4 F (36.9 C) (12/30 2106) Temp Source: Oral (12/30 2106) BP: 111/61 mmHg (12/30 2106) Pulse Rate: 102 (12/30 2106) Intake/Output from previous day:   Intake/Output from this shift:    Labs:  Recent Labs  06/01/15 1729  WBC 14.3*  HGB 14.1  PLT 210  CREATININE 1.07*   Estimated Creatinine Clearance: 35.6 mL/min (by C-G formula based on Cr of 1.07). No results for input(s): VANCOTROUGH, VANCOPEAK, VANCORANDOM, GENTTROUGH, GENTPEAK, GENTRANDOM, TOBRATROUGH, TOBRAPEAK, TOBRARND, AMIKACINPEAK, AMIKACINTROU, AMIKACIN in the last 72 hours.   Microbiology: No results found for this or any previous visit (from the past 720 hour(s)).  Medical History: Past Medical History  Diagnosis Date  . Mitral valve insufficiency   . Diabetes mellitus without complication ([place])   . Sleep apnea   . Hyperlipidemia   . Hypertension   . History of breast cancer   . Anemia   . Urinary incontinence   . Osteoarthritis   . GERD (gastroesophageal reflux disease)   . History of uterine cancer   . Non-Hodgkin's lymphoma ([place])   . Incontinence     Medications:  Prescriptions prior to admission  Medication Sig Dispense Refill Last Dose  . acetaminophen (TYLENOL) 325 MG tablet Take 325 mg by mouth every 6 (six) hours as needed.    05/22/2015  . ALPRAZolam (XANAX) 0.25 MG tablet Take 0.25 mg by mouth every 12 (twelve) hours as needed for anxiety.    prn  . Calcium Carbonate-Vitamin D (CALCIUM 600+D) 600-400  MG-UNIT per tablet Take 1 tablet by mouth 2 (two) times daily.   06/01/2015 at 0900  . carbidopa-levodopa (SINEMET IR) 10-100 MG tablet Take 1 tablet by mouth 2 (two) times daily.   06/01/2015 at 0800  . cloNIDine (CATAPRES) 0.1 MG tablet Take 1 tablet (0.1 mg total) by mouth 2 (two) times daily. 60 tablet 3 06/01/2015 at 0900  . glipiZIDE (GLUCOTROL) 5 MG tablet Take 2.5 mg by mouth daily before breakfast.   06/01/2015 at 0900  . hydrochlorothiazide (HYDRODIURIL) 25 MG tablet Take 1 tablet (25 mg total) by mouth daily. 30 tablet 11 06/01/2015 at 0900  . losartan (COZAAR) 50 MG tablet Take 2 tablets (100 mg total) by mouth daily. 60 tablet 6 06/01/2015 at 0900  . meloxicam (MOBIC) 7.5 MG tablet Take 7.5 mg by mouth daily.   06/01/2015 at 0900  . omeprazole (PRILOSEC) 20 MG capsule Take 20 mg by mouth daily.   06/01/2015 at 0600  . QUEtiapine (SEROQUEL) 25 MG tablet Take 25 mg by mouth at bedtime.   05/31/2015 at 2100  . sertraline (ZOLOFT) 100 MG tablet Take 150 mg by mouth daily.    06/01/2015 at 0900  . vitamin C (ASCORBIC ACID) 250 MG tablet Take 250 mg by mouth 2 (two) times daily.   06/01/2015 at 0900   Assessment: No Pseudomonas risk factors noted.   CrCl = 35.6 ml/min Ke = 0.03 hr-1 T1/2 = 23.1 hrs Vd = 41.1 L   Goal of Therapy:  Vancomycin trough level 15-20 mcg/ml  Plan:  Expected duration 7 days with resolution of temperature and/or normalization of WBC   Will order Zosyn 3.375 gm IV Q8H EI to start 12/30.  Vancomycin 750 mg IV X 1 ordered to start 12/30 @ 23:00. Vancomycin 750 mg IV Q24H ordered to start 12/31 @ 6:00, 7 hrs after 1st dose. This pt will reach Css by 1/4 @ 23:00. Will draw 1st trough on 1/3 @ 5:30, which will not be at Css.   Eloisa Chokshi D 06/01/2015,9:42 PM

## 2015-06-01 NOTE — ED Notes (Signed)
Patient states that she does not want to go home with her caregiver, because she does not feel safe. MD Kerman Passey and Kaslasetti aware

## 2015-06-02 DIAGNOSIS — L8990 Pressure ulcer of unspecified site, unspecified stage: Secondary | ICD-10-CM | POA: Insufficient documentation

## 2015-06-02 LAB — GLUCOSE, CAPILLARY
GLUCOSE-CAPILLARY: 248 mg/dL — AB (ref 65–99)
GLUCOSE-CAPILLARY: 187 mg/dL — AB (ref 65–99)
GLUCOSE-CAPILLARY: 165 mg/dL — AB (ref 65–99)
Glucose-Capillary: 158 mg/dL — ABNORMAL HIGH (ref 65–99)

## 2015-06-02 LAB — CBC
HCT: 37.2 % (ref 35.0–47.0)
Hemoglobin: 11.5 g/dL — ABNORMAL LOW (ref 12.0–16.0)
MCH: 26.3 pg (ref 26.0–34.0)
MCHC: 30.9 g/dL — ABNORMAL LOW (ref 32.0–36.0)
MCV: 85.1 fL (ref 80.0–100.0)
PLATELETS: 178 10*3/uL (ref 150–440)
RBC: 4.37 MIL/uL (ref 3.80–5.20)
RDW: 18 % — ABNORMAL HIGH (ref 11.5–14.5)
WBC: 16.6 10*3/uL — AB (ref 3.6–11.0)

## 2015-06-02 LAB — TROPONIN I
TROPONIN I: 0.11 ng/mL — AB (ref <0.031)
Troponin I: 0.08 ng/mL — ABNORMAL HIGH (ref <0.031)

## 2015-06-02 LAB — MRSA PCR SCREENING: MRSA BY PCR: NEGATIVE

## 2015-06-02 MED ORDER — SODIUM CHLORIDE 0.9 % IV BOLUS (SEPSIS)
500.0000 mL | Freq: Once | INTRAVENOUS | Status: AC
  Administered 2015-06-02: 14:00:00 500 mL via INTRAVENOUS

## 2015-06-02 MED ORDER — ENOXAPARIN SODIUM 30 MG/0.3ML ~~LOC~~ SOLN
30.0000 mg | SUBCUTANEOUS | Status: DC
  Administered 2015-06-02 – 2015-06-03 (×2): 30 mg via SUBCUTANEOUS
  Filled 2015-06-02 (×2): qty 0.3

## 2015-06-02 MED ORDER — SODIUM CHLORIDE 0.9 % IV BOLUS (SEPSIS)
500.0000 mL | INTRAVENOUS | Status: AC
  Administered 2015-06-02: 09:00:00 500 mL via INTRAVENOUS

## 2015-06-02 MED ORDER — SODIUM CHLORIDE 0.9 % IV SOLN
Freq: Once | INTRAVENOUS | Status: AC
  Administered 2015-06-02: 06:00:00 via INTRAVENOUS

## 2015-06-02 MED ORDER — POTASSIUM CHLORIDE IN NACL 40-0.9 MEQ/L-% IV SOLN
INTRAVENOUS | Status: AC
  Administered 2015-06-02: 75 mL/h via INTRAVENOUS
  Filled 2015-06-02: qty 1000

## 2015-06-02 MED ORDER — SODIUM CHLORIDE 0.9 % IV SOLN
Freq: Once | INTRAVENOUS | Status: AC
  Administered 2015-06-02: 07:00:00 via INTRAVENOUS

## 2015-06-02 MED ORDER — MORPHINE SULFATE (PF) 2 MG/ML IV SOLN
1.0000 mg | INTRAVENOUS | Status: DC | PRN
  Administered 2015-06-02 – 2015-06-03 (×3): 1 mg via INTRAVENOUS
  Filled 2015-06-02 (×3): qty 1

## 2015-06-02 NOTE — Progress Notes (Signed)
Pt bp 77/34, rapid response called, md notified. MD notified family and agreed to keep pt comfortable.

## 2015-06-02 NOTE — Progress Notes (Signed)
ANTICOAGULATION CONSULT NOTE   Pharmacy Consult for Renal Dose Adjustment of Enoxaparin  Indication: VTE prophylaxis  Patient Measurements: Height: 5\' 2"  (157.5 cm) Weight: 169 lb (76.658 kg) IBW/kg (Calculated) : 50.1 Labs:  Recent Labs  06/01/15 1729 06/01/15 1920 06/02/15 0131 06/02/15 0723  HGB 14.1  --   --  11.5*  HCT 45.1  --   --  37.2  PLT 210  --   --  178  CREATININE 1.07*  --   --  1.96*  TROPONINI 0.07* 0.07* 0.08* 0.11*    Estimated Creatinine Clearance: 20.1 mL/min (by C-G formula based on Cr of 1.96).  Assessment: 81 yo female needed VTE prophylaxis with enoxaparin while inpatient. Patient was started on enoxaparin 40mg  Subq daily.  CrCl: 20.1   Scr: 1.96 (up from 1.07 on 12/30)    Plan:  Due to patients CrCl <63ml/min will change dose to enoxaparin 30mg  subQ daily per [facility] policy.  Will contact hospitalist and recommend change from enoxaparin to heparin due to increase of 0.8 in Scr.   Nancy Fetter, PharmD Pharmacy Resident  06/02/2015,9:19 AM

## 2015-06-02 NOTE — Significant Event (Signed)
Rapid Response Event Note  Overview: called to room 110 for low bp, 77/34/ pt laying in bed, no distress- DNR noted      Initial Focused Assessment:   Interventions: Spoke with Dr. Anselm Jungling who spoke with POA and was making pt comfort care  Event Summary:   at      at          [place]

## 2015-06-02 NOTE — Progress Notes (Signed)
Patient is admitted to room 110 with the diagnosis of aspiration pneumonia. Alert and oriented x 2. No s/s of pain or any acute distress noted. Patient oriented to her room, staff and call bell. Skin assessment done with Jarrett Soho RN, noted skin tear to the top of her left hand, redness to the right side of the chest, moisture associated skin damage to bilateral groin and bilateral under breast, bruises on bilateral arms and dry flaky skin. Patient has gurgling voice. Will continue to monitor.

## 2015-06-02 NOTE — Progress Notes (Signed)
Seen pt in morning. BP was running in lower side, given multiple NS bolus , Again Blood pressure is dropping < 80 systolic.  Assessment and plan   Septic shock due to severe aspiration pneumonia   Pt is on broad spectrum ABx, and IV fluid support.  I spoke to her HCPOA- as the number in chart. Informed her twice about progressive worsening condition- and possibly pt may not be able to survive from this infection.  She understands and respects her wishes of DNR- and if pt get agitated or in distress- she also agreed to give her morphin to keep her comfortable.  She will update her other relatives.  Critical care time spent 45 min.

## 2015-06-02 NOTE — Progress Notes (Signed)
Patient's BP=83/47 and that's the most the RN has been able to get. Dr. Darvin Neighbours informed with a new order for 500 mL of NS bolus x 1 dose. Will recheck after the bolus. Temp=99.9 Will continue to monitor.

## 2015-06-02 NOTE — Progress Notes (Signed)
SLP eval attempted. Chart reviewed, nsg consulted, Pt visited. Pt was lethargic, but able to open eyes with stimulation. Noted gurgly vocal quality when moaning. Allowed ST to perform oral care. Given current lethargy, wet breath sounds and no oral movement with oral care, rec continue NPO as Pt is high risk for aspiration with any attmepts at PO's. SLP to assess once more alert in hopes of starting a modified diet. Pt with dysphagia at baseling per H and P

## 2015-06-02 NOTE — Progress Notes (Signed)
Md aware of low bp 76/35. MD will contact family and orders put in for fluid bolus.

## 2015-06-02 NOTE — Progress Notes (Signed)
Initial Nutrition Assessment   INTERVENTION:   Coordination of Care: await diet progression as medically able; SLP following and reports pt unsafe for diet advancement this am. Will also request  new weight to clarify accuracy as encounters show a 50lbs weight difference since last admission.  NUTRITION DIAGNOSIS:   Inadequate oral intake related to inability to eat as evidenced by NPO status.  GOAL:   Patient will meet greater than or equal to 90% of their needs  MONITOR:    (Eenrgy Intake, Digestive System, Anthropometrics, Glucose Profile, Skin)  REASON FOR ASSESSMENT:   Diagnosis    ASSESSMENT:   Pt admitted from [facility] with aspiration pna. Per MD note, pt with admission last month with aspiration pna and discharged on dysphagia diet; also noted neice was giving thin liquids past few days PTA. Pt lethargic, mumbling this am on visit. Pt with low BP this am per Nsg.  Past Medical History  Diagnosis Date  . Mitral valve insufficiency   . Diabetes mellitus without complication ([place])   . Sleep apnea   . Hyperlipidemia   . Hypertension   . History of breast cancer   . Anemia   . Urinary incontinence   . Osteoarthritis   . GERD (gastroesophageal reflux disease)   . History of uterine cancer   . Non-Hodgkin's lymphoma ([place])   . Incontinence     Diet Order:  Diet NPO time specified    Current Nutrition: Pt currently NPO  Food/Nutrition-Related History: Pt previous diet order Dysphagia I, Nectar thick. RD notes pt drinking ginger ale and ice cream with niece a few days PTA.  Last month pt eating 40-60% of meals during admission and was receiving Mighty Shakes.   Scheduled Medications:  . calcium-vitamin D  1 tablet Oral BID  . cloNIDine  0.1 mg Oral BID  . enoxaparin (LOVENOX) injection  30 mg Subcutaneous Q24H  . glipiZIDE  2.5 mg Oral QAC breakfast  . insulin aspart  0-5 Units Subcutaneous QHS  . insulin aspart  0-9 Units Subcutaneous TID WC  .  losartan  100 mg Oral Daily  . meloxicam  7.5 mg Oral Daily  . multivitamin-lutein  1 capsule Oral Daily  . nystatin  1-5 g Topical TID  . pantoprazole  40 mg Oral QAC breakfast  . piperacillin-tazobactam (ZOSYN)  IV  3.375 g Intravenous 3 times per day  . sertraline  150 mg Oral Daily  . vancomycin  750 mg Intravenous Q24H    Continuous Medications:  . 0.9 % NaCl with KCl 40 mEq / L 75 mL/hr (06/02/15 0936)     Electrolyte/Renal Profile and Glucose Profile:   Recent Labs Lab 06/01/15 1729 06/02/15 0723  NA 156* 154*  K 2.9* 2.6*  CL 109 112*  CO2 35* 33*  BUN 45* 52*  CREATININE 1.07* 1.96*  CALCIUM 9.6 8.2*  GLUCOSE 192* 279*   Protein Profile:  Recent Labs Lab 06/01/15 1729  ALBUMIN 3.5    Gastrointestinal Profile: Last BM:  06/02/2015   Nutrition-Focused Physical Exam Findings:  Unable to complete Nutrition-Focused physical exam at this time.    Weight Change: Per [facility] encounters, weight loss of 23% in 1.5 months.    Skin:   (Stage II sacral pressure ulcer)   Height:   Ht Readings from Last 1 Encounters:  06/01/15 5\' 2"  (1.575 m)    Weight:   Wt Readings from Last 1 Encounters:  06/01/15 169 lb (76.658 kg)    Wt Readings from  Last 10 Encounters:  06/01/15 169 lb (76.658 kg)  04/09/15 220 lb 12.8 oz (100.154 kg)  10/02/14 228 lb 5 oz (103.562 kg)  07/03/14 206 lb (93.441 kg)    BMI:  Body mass index is 30.9 kg/(m^2).  Estimated Nutritional Needs:   Kcal:  BEE: 899kcals, TEE: (IF 1.1-1.3)(AF 1.2) 1185-1401kcals, using IBW of 50kg  Protein:  40-50g protein (0.8-1.0g/kg)  Fluid:  1250-1521mL of fluid (25-59mL/kg)  EDUCATION NEEDS:   Education needs no appropriate at this time   [place], RD, LDN Pager [phone] Weekend/On-Call Pager [phone]

## 2015-06-02 NOTE — Progress Notes (Signed)
Dr. Anselm Jungling is aware of pts last 4 blood pressure readings the last being 89/41, also he acknowledged that pt's troponin levels have increased to 0.11, wound consult ordered, family at bedside.

## 2015-06-02 NOTE — Consult Note (Signed)
  Psychiatry: Consult received today. I came by to see the patient and found that her clinical condition seems to have deteriorated. Chart reviewed. Attempted to speak with the patient but she did not respond to verbal stimuli or gentle shaking of the shoulder. The patient's healthcare power of attorney was present and told me that she had been there for some time this afternoon and that the patient was not able to wake up and not able to speak and that it was her understanding that the patient was being "just kept comfortable".  Obviously consult impossible. If the situation changes and I can be of service please let me know again. I will mention that it seems somewhat strange to me that the healthcare power of attorney came out of the room and found me in the nursing station and insisted that I tell her who had ordered the psychiatry consult and at exactly what time and then stated that she intended to find out why they would want to do that. No further follow-up.

## 2015-06-02 NOTE — Progress Notes (Addendum)
Dr. Anselm Jungling notified of BP 91/40 after 2nd 539ml bolus. Ordered to give another 551ml bolus. Hold all PO meds and make patient completely NPO. Updated friend, David Stall [facility]).

## 2015-06-02 NOTE — Progress Notes (Signed)
BP=93/40 after the bolus. DR Benjie Karvonen notified with a new order for another 500 cc bolus. Will recheck later. No s/s of pain noted or any acute distress.

## 2015-06-03 LAB — GLUCOSE, CAPILLARY: Glucose-Capillary: 158 mg/dL — ABNORMAL HIGH (ref 65–99)

## 2015-06-03 MED ORDER — MORPHINE SULFATE (PF) 2 MG/ML IV SOLN
1.0000 mg | INTRAVENOUS | Status: DC | PRN
  Administered 2015-06-03 – 2015-06-04 (×3): 1 mg via INTRAVENOUS
  Filled 2015-06-03 (×3): qty 1

## 2015-06-03 MED ORDER — LORAZEPAM 2 MG/ML IJ SOLN
0.5000 mg | Freq: Four times a day (QID) | INTRAMUSCULAR | Status: DC | PRN
  Administered 2015-06-03 – 2015-06-04 (×2): 0.5 mg via INTRAVENOUS
  Filled 2015-06-03 (×2): qty 1

## 2015-06-03 MED ORDER — PIPERACILLIN-TAZOBACTAM 3.375 G IVPB
3.3750 g | Freq: Two times a day (BID) | INTRAVENOUS | Status: DC
  Administered 2015-06-03 – 2015-06-04 (×2): 3.375 g via INTRAVENOUS
  Filled 2015-06-03 (×4): qty 50

## 2015-06-03 MED ORDER — GLYCOPYRROLATE 0.2 MG/ML IJ SOLN
0.1000 mg | Freq: Two times a day (BID) | INTRAMUSCULAR | Status: DC | PRN
  Administered 2015-06-03 – 2015-06-04 (×2): 0.1 mg via INTRAVENOUS
  Filled 2015-06-03 (×2): qty 1

## 2015-06-03 NOTE — Progress Notes (Signed)
[facility] [place] NAME: Sarah Orozco    MR#:  [id number]  DATE OF BIRTH:  11-Feb-1928  SUBJECTIVE:  CHIEF COMPLAINT:   Chief Complaint  Patient presents with  . Shortness of Breath    Drowsy, and slightly hypotensive. REVIEW OF SYSTEMS:   Pt is not able to give any ROS due to drowsiness. ROS  DRUG ALLERGIES:   Allergies  Allergen Reactions  . Adhesive [Tape] Other (See Comments)    Nursing home records...unknown side effect  . Percocet [Oxycodone-Acetaminophen] Other (See Comments)    From nursing home records, unknown side effect    VITALS:  Blood pressure 86/46, pulse 96, temperature 97.6 F (36.4 C), temperature source Oral, resp. rate 20, height 5\' 2"  (1.575 m), weight 80.831 kg (178 lb 3.2 oz), SpO2 93 %.  PHYSICAL EXAMINATION:  GENERAL:  82 y.o.-year-old patient lying in the bed with no acute distress.  EYES: Pupils equal, round, reactive to light and accommodation. No scleral icterus.  HEENT: Head atraumatic, normocephalic. Oropharynx and nasopharynx clear.  NECK:  Supple, no jugular venous distention. No thyroid enlargement, no tenderness.  LUNGS: Normal breath sounds bilaterally, mild wheezing,some crepitation. No use of accessory muscles of respiration.  CARDIOVASCULAR: S1, S2 normal. No murmurs, rubs, or gallops.  ABDOMEN: Soft, nontender, nondistended. Bowel sounds present. No organomegaly or mass.  EXTREMITIES: No pedal edema, cyanosis, or clubbing.  NEUROLOGIC: pt is drowsy, not able to check power or sensation, pupils reactive to light, pt does not follow commands. PSYCHIATRIC: The patient is drowsy.  SKIN: No obvious rash, lesion, or ulcer.   Physical Exam LABORATORY PANEL:   CBC  Recent Labs Lab 06/02/15 0723  WBC 16.6*  HGB 11.5*  HCT 37.2  PLT 178   ------------------------------------------------------------------------------------------------------------------  Chemistries   Recent  Labs Lab 06/01/15 1729 06/02/15 0723  NA 156* 154*  K 2.9* 2.6*  CL 109 112*  CO2 35* 33*  GLUCOSE 192* 279*  BUN 45* 52*  CREATININE 1.07* 1.96*  CALCIUM 9.6 8.2*  AST 22  --   ALT 26  --   ALKPHOS 89  --   BILITOT 1.4*  --    ------------------------------------------------------------------------------------------------------------------  Cardiac Enzymes  Recent Labs Lab 06/02/15 0131 06/02/15 0723  TROPONINI 0.08* 0.11*   ------------------------------------------------------------------------------------------------------------------  RADIOLOGY:  Ct Head Wo Contrast  06/01/2015  CLINICAL DATA:  82 year old female with altered mental status and question left-sided weakness. EXAM: CT HEAD WITHOUT CONTRAST TECHNIQUE: Contiguous axial images were obtained from the base of the skull through the vertex without intravenous contrast. COMPARISON:  CT dated 03/16/2014 FINDINGS: There is stable enlargement the ventricles and sulci compatible with age-related atrophy. Bifrontal cortical atrophy similar to prior study. New Periventricular and deep white matter hypodensities represent chronic microvascular ischemic changes. There is no intracranial hemorrhage. No mass effect or midline shift identified. The visualized paranasal sinuses and mastoid air cells are well aerated. The calvarium is intact. IMPRESSION: No acute intracranial hemorrhage. Age-related atrophy and chronic microvascular ischemic disease. If symptoms persist and there are no contraindications, MRI may provide better evaluation if clinically indicated. Electronically Signed   By: Anner Crete M.D.   On: 06/01/2015 18:21   Dg Chest Portable 1 View  06/01/2015  CLINICAL DATA:  Aspiration pneumonia.  Left-sided weakness. EXAM: PORTABLE CHEST 1 VIEW COMPARISON:  04/12/2015 FINDINGS: Tortuous and atherosclerotic thoracic aorta as on prior exams. Low lung volumes. Bandlike opacities at both lung bases favoring atelectasis.  The lungs appear  otherwise clear. IMPRESSION: 1. Bibasilar bandlike opacities favoring atelectasis. 2. Aortic atherosclerosis and tortuosity. Electronically Signed   By: Van Clines M.D.   On: 06/01/2015 17:55    ASSESSMENT AND PLAN:   Active Problems:   Aspiration pneumonia (HCC)   Pressure ulcer  #1 Aspiration pneumonia- recurrent. Likely from drinking thin liquids per POA given by patients niece over the last 4 days - CXR with bibasilar infiltrates - Blood cultures. IV vanc and zosyn until cultures are negative- then ABX can be narrowed - NPO, repeat speech consult- when more alert. - meds in apple sauce - aspiration precautions - o2 support as needed- on 2L chronic o2 - Has Blood pressure running on lower side- spoke to her HCPOA twice to update her about the condition,      She agreed with DNR-a nd would like to continue IV Abx, but keep her comfortable with morphine if have Distress.  2 hypernatremia-secondary to free water deficit -D5 half normal saline started for now. Monitor.  #3 hypokalemia-being replaced   #4 diabetes mellitus-continue glipizide. On sliding scale insulin as well. -Monitor especially while on D5 fluids.  #5 hypertension-restart Cozaar and clonidine  #6 depression-continue Zoloft. Also on Xanax when necessary for anxiety  #7 DVT prophylaxis-on Lovenox     All the records are reviewed and case discussed with Care Management/Social Workerr. Management plans discussed with the patient, family and they are in agreement.  CODE STATUS: DNR  TOTAL TIME TAKING CARE OF THIS PATIENT: 35 minutes.    POSSIBLE D/C IN 1-2 DAYS, DEPENDING ON CLINICAL CONDITION.   Vaughan Basta M.D on 06/03/2015   Between 7am to 6pm - Pager - [phone]  After 6pm go to [email or web address] - password [email or web address] [facility]  Office  [phone]  CC: Primary care physician; Kirk Ruths., MD  Note: This dictation was prepared with  Dragon dictation along with smaller phrase technology. Any transcriptional errors that result from this process are unintentional.

## 2015-06-03 NOTE — Plan of Care (Signed)
Problem: Safety: Goal: Ability to remain free from injury will improve Outcome: Progressing Pt is high falls, bed alarm in use, call bell and phone within reach, hourly safety rounds, pt remains free of injury this shift  Problem: Health Behavior/Discharge Planning: Goal: Ability to manage health-related needs will improve Outcome: Progressing No discharge plans at this time  Problem: Pain Managment: Goal: General experience of comfort will improve Outcome: Progressing Pt is comfort care, pt has S/S of pain, prn meds given with improvement, pt resting comfortably in bed

## 2015-06-03 NOTE — Clinical Social Work Note (Signed)
Clinical Social Worker consulted as pt was admitted from [facility]. Per H&P, HCPOA does not want pt to return to facility. Pt is now on comfort care. Full assessment to follow if appropriate. CSW will continue to follow.   Darden Dates, MSW, LCSW Clinical Social Worker  [phone]

## 2015-06-03 NOTE — Progress Notes (Signed)
ANTIBIOTIC CONSULT NOTE - FOLLOW UP   Pharmacy Consult for Zosyn  Indication: Aspiration PNA   Allergies  Allergen Reactions  . Adhesive [Tape] Other (See Comments)    Nursing home records...unknown side effect  . Percocet [Oxycodone-Acetaminophen] Other (See Comments)    From nursing home records, unknown side effect    Patient Measurements: Height: 5\' 2"  (157.5 cm) Weight: 178 lb 3.2 oz (80.831 kg) IBW/kg (Calculated) : 50.1 Adjusted Body Weight: 41.1 L   Vital Signs: Temp: 97.6 F (36.4 C) (01/01 0515) Temp Source: Oral (01/01 0515) BP: 86/46 mmHg (01/01 0515) Pulse Rate: 96 (01/01 0515) Intake/Output from previous day: 12/31 0701 - 01/01 0700 In: 500 [I.V.:500] Out: -  Intake/Output from this shift:    Labs:  Recent Labs  06/01/15 1729 06/02/15 0723  WBC 14.3* 16.6*  HGB 14.1 11.5*  PLT 210 178  CREATININE 1.07* 1.96*   Estimated Creatinine Clearance: 20.7 mL/min (by C-G formula based on Cr of 1.96). No results for input(s): VANCOTROUGH, VANCOPEAK, VANCORANDOM, GENTTROUGH, GENTPEAK, GENTRANDOM, TOBRATROUGH, TOBRAPEAK, TOBRARND, AMIKACINPEAK, AMIKACINTROU, AMIKACIN in the last 72 hours.   Microbiology: Recent Results (from the past 720 hour(s))  Blood culture (routine x 2)     Status: None (Preliminary result)   Collection Time: 06/01/15  9:18 PM  Result Value Ref Range Status   Specimen Description BLOOD RIGHT ASSIST CONTROL  Final   Special Requests BOTTLES DRAWN AEROBIC AND ANAEROBIC 4CC  Final   Culture NO GROWTH < 24 HOURS  Final   Report Status PENDING  Incomplete  Blood culture (routine x 2)     Status: None (Preliminary result)   Collection Time: 06/01/15  9:28 PM  Result Value Ref Range Status   Specimen Description BLOOD RIGHT HAND  Final   Special Requests BOTTLES DRAWN AEROBIC AND ANAEROBIC 4CC  Final   Culture NO GROWTH < 24 HOURS  Final   Report Status PENDING  Incomplete  MRSA PCR Screening     Status: None   Collection Time: 06/02/15   2:00 AM  Result Value Ref Range Status   MRSA by PCR NEGATIVE NEGATIVE Final    Comment:        The GeneXpert MRSA Assay (FDA approved for NASAL specimens only), is one component of a comprehensive MRSA colonization surveillance program. It is not intended to diagnose MRSA infection nor to guide or monitor treatment for MRSA infections.     Medical History: Past Medical History  Diagnosis Date  . Mitral valve insufficiency   . Diabetes mellitus without complication ([place])   . Sleep apnea   . Hyperlipidemia   . Hypertension   . History of breast cancer   . Anemia   . Urinary incontinence   . Osteoarthritis   . GERD (gastroesophageal reflux disease)   . History of uterine cancer   . Non-Hodgkin's lymphoma ([place])   . Incontinence     Medications:  Prescriptions prior to admission  Medication Sig Dispense Refill Last Dose  . acetaminophen (TYLENOL) 325 MG tablet Take 325 mg by mouth every 6 (six) hours as needed.    05/22/2015  . ALPRAZolam (XANAX) 0.25 MG tablet Take 0.25 mg by mouth every 12 (twelve) hours as needed for anxiety.    prn  . Calcium Carbonate-Vitamin Orozco (CALCIUM 600+Orozco) 600-400 MG-UNIT per tablet Take 1 tablet by mouth 2 (two) times daily.   06/01/2015 at 0900  . carbidopa-levodopa (SINEMET IR) 10-100 MG tablet Take 1 tablet by mouth 2 (two) times daily.  06/01/2015 at 0800  . cloNIDine (CATAPRES) 0.1 MG tablet Take 1 tablet (0.1 mg total) by mouth 2 (two) times daily. 60 tablet 3 06/01/2015 at 0900  . glipiZIDE (GLUCOTROL) 5 MG tablet Take 2.5 mg by mouth daily before breakfast.   06/01/2015 at 0900  . hydrochlorothiazide (HYDRODIURIL) 25 MG tablet Take 1 tablet (25 mg total) by mouth daily. 30 tablet 11 06/01/2015 at 0900  . losartan (COZAAR) 50 MG tablet Take 2 tablets (100 mg total) by mouth daily. 60 tablet 6 06/01/2015 at 0900  . meloxicam (MOBIC) 7.5 MG tablet Take 7.5 mg by mouth daily.   06/01/2015 at 0900  . omeprazole (PRILOSEC) 20 MG capsule Take 20  mg by mouth daily.   06/01/2015 at 0600  . QUEtiapine (SEROQUEL) 25 MG tablet Take 25 mg by mouth at bedtime.   05/31/2015 at 2100  . sertraline (ZOLOFT) 100 MG tablet Take 150 mg by mouth daily.    06/01/2015 at 0900  . vitamin C (ASCORBIC ACID) 250 MG tablet Take 250 mg by mouth 2 (two) times daily.   06/01/2015 at 0900   Assessment: No Pseudomonas risk factors noted.  Patient is AKI, creatinine jumped from 1.07 to 1.96 mg/dl. CrCl can't be accurately calculated in AKI; therefore will dose like CrCL <10 ml/min.  Goal of Therapy:  Vancomycin trough level 15-20 mcg/ml  Plan:  Expected duration 7 days with resolution of temperature and/or normalization of WBC   Will change to  Zosyn 3.375 gm IV Q12H. Pharmacy to follow.    Sarah Orozco 06/03/2015,8:21 AM

## 2015-06-03 NOTE — Plan of Care (Signed)
Problem: Safety: Goal: Ability to remain free from injury will improve Outcome: Progressing Patient lethargic this shift but will respond after repeated stimulation.  Patient is high fall risk and remained in bed this shift.  Call light within reach, but patient not able to understand the use.  Bed alarm on throughout shift.    Problem: Pain Managment: Goal: General experience of comfort will improve Outcome: Progressing Patient with frequent vocalizations this shift, but sound asleep upon assessment.  Morphine given x1 for patient comfort.  Patient able to rest throughout shift.    Problem: Physical Regulation: Goal: Ability to maintain clinical measurements within normal limits will improve Outcome: Progressing Patient afebrile and VSS this shift.    Problem: Nutrition: Goal: Adequate nutrition will be maintained Outcome: Not Applicable Date Met:  06/03/15 Patient NPO due to aspiration pneumonia.      

## 2015-06-03 NOTE — Progress Notes (Signed)
[place] at [place] NAME: Sarah Orozco    MR#:  [id number]  DATE OF BIRTH:  09-15-1927  SUBJECTIVE:  CHIEF COMPLAINT:   Chief Complaint  Patient presents with  . Shortness of Breath    Drowsy, and slightly hypotensive.  remains lethargic. Her HCPOA- Ms Jana Half present in room. REVIEW OF SYSTEMS:   Pt is not able to give any ROS due to drowsiness.   ROS  DRUG ALLERGIES:   Allergies  Allergen Reactions  . Adhesive [Tape] Other (See Comments)    Nursing home records...unknown side effect  . Percocet [Oxycodone-Acetaminophen] Other (See Comments)    From nursing home records, unknown side effect    VITALS:  Blood pressure 86/46, pulse 96, temperature 97.6 F (36.4 C), temperature source Oral, resp. rate 20, height 5\' 2"  (1.575 m), weight 80.831 kg (178 lb 3.2 oz), SpO2 93 %.  PHYSICAL EXAMINATION:  GENERAL:  82 y.o.-year-old patient lying in the bed with no acute distress.  EYES: Pupils equal, round, reactive to light and accommodation. No scleral icterus.  HEENT: Head atraumatic, normocephalic. Oropharynx and nasopharynx clear.  NECK:  Supple, no jugular venous distention. No thyroid enlargement, no tenderness.  LUNGS: Normal breath sounds bilaterally, mild wheezing,some crepitation. No use of accessory muscles of respiration.  CARDIOVASCULAR: S1, S2 normal. No murmurs, rubs, or gallops.  ABDOMEN: Soft, nontender, nondistended. Bowel sounds present. No organomegaly or mass.  EXTREMITIES: No pedal edema, cyanosis, or clubbing.  NEUROLOGIC: pt is drowsy, not able to check power or sensation, pupils reactive to light, pt does not follow commands. PSYCHIATRIC: The patient is drowsy.  SKIN: No obvious rash, lesion, or ulcer.   Physical Exam LABORATORY PANEL:   CBC  Recent Labs Lab 06/02/15 0723  WBC 16.6*  HGB 11.5*  HCT 37.2  PLT 178    ------------------------------------------------------------------------------------------------------------------  Chemistries   Recent Labs Lab 06/01/15 1729 06/02/15 0723  NA 156* 154*  K 2.9* 2.6*  CL 109 112*  CO2 35* 33*  GLUCOSE 192* 279*  BUN 45* 52*  CREATININE 1.07* 1.96*  CALCIUM 9.6 8.2*  AST 22  --   ALT 26  --   ALKPHOS 89  --   BILITOT 1.4*  --    ------------------------------------------------------------------------------------------------------------------  Cardiac Enzymes  Recent Labs Lab 06/02/15 0131 06/02/15 0723  TROPONINI 0.08* 0.11*   ------------------------------------------------------------------------------------------------------------------  RADIOLOGY:  Ct Head Wo Contrast  06/01/2015  CLINICAL DATA:  82 year old female with altered mental status and question left-sided weakness. EXAM: CT HEAD WITHOUT CONTRAST TECHNIQUE: Contiguous axial images were obtained from the base of the skull through the vertex without intravenous contrast. COMPARISON:  CT dated 03/16/2014 FINDINGS: There is stable enlargement the ventricles and sulci compatible with age-related atrophy. Bifrontal cortical atrophy similar to prior study. New Periventricular and deep white matter hypodensities represent chronic microvascular ischemic changes. There is no intracranial hemorrhage. No mass effect or midline shift identified. The visualized paranasal sinuses and mastoid air cells are well aerated. The calvarium is intact. IMPRESSION: No acute intracranial hemorrhage. Age-related atrophy and chronic microvascular ischemic disease. If symptoms persist and there are no contraindications, MRI may provide better evaluation if clinically indicated. Electronically Signed   By: Anner Crete M.D.   On: 06/01/2015 18:21   Dg Chest Portable 1 View  06/01/2015  CLINICAL DATA:  Aspiration pneumonia.  Left-sided weakness. EXAM: PORTABLE CHEST 1 VIEW COMPARISON:  04/12/2015  FINDINGS: Tortuous and atherosclerotic thoracic aorta as on prior exams. Low lung  volumes. Bandlike opacities at both lung bases favoring atelectasis. The lungs appear otherwise clear. IMPRESSION: 1. Bibasilar bandlike opacities favoring atelectasis. 2. Aortic atherosclerosis and tortuosity. Electronically Signed   By: Van Clines M.D.   On: 06/01/2015 17:55    ASSESSMENT AND PLAN:   Active Problems:   Aspiration pneumonia (HCC)   Pressure ulcer  #1 Aspiration pneumonia- recurrent. Likely from drinking thin liquids per POA given by patients niece over the last 4 days - CXR with bibasilar infiltrates - Blood cultures. IV vanc and zosyn  - NPO, repeat speech consult- when more alert. - o2 support as needed- on 2L chronic o2 HCPOA agreed on Comfort care with continueing Abx, will continue care accordingly. Hold all oral meds as pt is drowsy.  #2 hypernatremia-secondary to free water deficit Given IV fluids.  #3 hypokalemia-being replaced   #4 diabetes mellitus-  hold oral meds and insulin, stop gluco checks also - as comfort care.  #5 hypertension-  held all meds due to lower Blood pressure- required some fluid boluses, now comfort care.  #6 depression-continue Zoloft. Also on Xanax when necessary for anxiety  #7 DVT prophylaxis-on Lovenox   All the records are reviewed and case discussed with Care Management/Social Workerr. Management plans discussed with the patient, family and they are in agreement.  CODE STATUS: DNR  TOTAL TIME TAKING CARE OF THIS PATIENT: 35 minutes.    POSSIBLE D/C IN 1-2 DAYS, DEPENDING ON CLINICAL CONDITION.   Vaughan Basta M.D on 06/03/2015   Between 7am to 6pm - Pager - [phone]  After 6pm go to [email or web address] - password [email or web address] [facility]  Office  [phone]  CC: Primary care physician; Kirk Ruths., MD  Note: This dictation was prepared with Dragon dictation along with smaller phrase  technology. Any transcriptional errors that result from this process are unintentional.

## 2015-06-04 LAB — BASIC METABOLIC PANEL
Anion gap: 9 (ref 5–15)
BUN: 52 mg/dL — ABNORMAL HIGH (ref 6–20)
CALCIUM: 8.2 mg/dL — AB (ref 8.9–10.3)
CHLORIDE: 112 mmol/L — AB (ref 101–111)
CO2: 33 mmol/L — ABNORMAL HIGH (ref 22–32)
CREATININE: 1.96 mg/dL — AB (ref 0.44–1.00)
GFR, EST AFRICAN AMERICAN: 26 mL/min — AB (ref >60)
GFR, EST NON AFRICAN AMERICAN: 22 mL/min — AB (ref >60)
Glucose, Bld: 279 mg/dL — ABNORMAL HIGH (ref 65–99)
Potassium: 2.6 mmol/L — CL (ref 3.5–5.1)
SODIUM: 154 mmol/L — AB (ref 135–145)

## 2015-06-04 MED ORDER — GLYCOPYRROLATE 0.2 MG/ML IJ SOLN
0.1000 mg | Freq: Four times a day (QID) | INTRAMUSCULAR | Status: DC | PRN
  Administered 2015-06-04: 0.1 mg via INTRAVENOUS
  Filled 2015-06-04: qty 1

## 2015-06-04 MED ORDER — MORPHINE SULFATE 20 MG/5ML PO SOLN: 5.0000 mg | ORAL | Status: AC | PRN

## 2015-06-04 MED ORDER — SCOPOLAMINE 1 MG/3DAYS TD PT72: 1.0000 | MEDICATED_PATCH | TRANSDERMAL | Status: AC

## 2015-06-04 NOTE — Progress Notes (Signed)
[place] at [place] NAME: Sarah Orozco    MR#:  [id number]  DATE OF BIRTH:  1928-03-28  SUBJECTIVE:  CHIEF COMPLAINT:   Chief Complaint  Patient presents with  . Shortness of Breath    Drowsy, and slightly hypotensive.  remains lethargic. Her HCPOA- Ms Jana Half present in room.  Her niece is also present in room.  Pt has gurgling soungs of breathing and she is not arousable.  REVIEW OF SYSTEMS:   Pt is not able to give any ROS due to drowsiness.   ROS  DRUG ALLERGIES:   Allergies  Allergen Reactions  . Adhesive [Tape] Other (See Comments)    Nursing home records...unknown side effect  . Percocet [Oxycodone-Acetaminophen] Other (See Comments)    From nursing home records, unknown side effect    VITALS:  Blood pressure 71/46, pulse 48, temperature 97.6 F (36.4 C), temperature source Oral, resp. rate 22, height 5\' 2"  (1.575 m), weight 80.831 kg (178 lb 3.2 oz), SpO2 64 %.  PHYSICAL EXAMINATION:  GENERAL:  82 y.o.-year-old patient lying in the bed with no acute distress.  EYES: Pupils equal, round, reactive to light and accommodation. No scleral icterus.  HEENT: Head atraumatic, normocephalic. Oropharynx and nasopharynx clear.  NECK:  Supple, no jugular venous distention. No thyroid enlargement, no tenderness.  LUNGS: Normal breath sounds bilaterally, mild wheezing,some crepitation.gurgling sounds of breathing. No use of accessory muscles of respiration.  CARDIOVASCULAR: S1, S2 normal. No murmurs, rubs, or gallops.  ABDOMEN: Soft, nontender, nondistended. Bowel sounds present. No organomegaly or mass.  EXTREMITIES: No pedal edema, cyanosis, or clubbing.  NEUROLOGIC: pt is drowsy, not able to check power or sensation, pupils reactive to light, pt does not follow commands. PSYCHIATRIC: The patient is drowsy.  SKIN: No obvious rash, lesion, or ulcer.   Physical Exam LABORATORY PANEL:   CBC  Recent Labs Lab  06/02/15 0723  WBC 16.6*  HGB 11.5*  HCT 37.2  PLT 178   ------------------------------------------------------------------------------------------------------------------  Chemistries   Recent Labs Lab 06/01/15 1729 06/02/15 0723  NA 156* 154*  K 2.9* 2.6*  CL 109 112*  CO2 35* 33*  GLUCOSE 192* 279*  BUN 45* 52*  CREATININE 1.07* 1.96*  CALCIUM 9.6 8.2*  AST 22  --   ALT 26  --   ALKPHOS 89  --   BILITOT 1.4*  --    ------------------------------------------------------------------------------------------------------------------  Cardiac Enzymes  Recent Labs Lab 06/02/15 0131 06/02/15 0723  TROPONINI 0.08* 0.11*   ------------------------------------------------------------------------------------------------------------------  RADIOLOGY:  No results found.  ASSESSMENT AND PLAN:   Active Problems:   Aspiration pneumonia (HCC)   Pressure ulcer  #1 Aspiration pneumonia- recurrent. Likely from drinking thin liquids per POA given by patients niece over the last 4 days - CXR with bibasilar infiltrates - Blood cultures negative. IV zosyn  - NPO, repeat speech consult- when more alert. - o2 support as needed- on 2L chronic o2 HCPOA agreed on Comfort care with continueing Abx, will continue care accordingly. Hold all oral meds as pt is drowsy. 06/04/15- again had discussion with HCPOA and pt's niece- the both agreed on comfort care and stopping her ABX, and consult hospice.  SPoke to pt's nurse and case manager about this arrangement.  #2 hypernatremia-secondary to free water deficit Given IV fluids.  now stopped due to concern of fluid overload or edema in lungs.  #3 hypokalemia- replaced   #4 diabetes mellitus-  hold oral meds and insulin, stop gluco checks  also - as comfort care.  #5 hypertension-  held all meds due to lower Blood pressure- required some fluid boluses, now comfort care.  #6 depression-continue Zoloft. Also on Xanax when necessary for  anxiety   Now comfort care- morphine for any signs of distress.  #7 DVT prophylaxis-on Lovenox   All the records are reviewed and case discussed with Care Management/Social Workerr. Management plans discussed with the patient, family and they are in agreement.  CODE STATUS: DNR- comfort care.  TOTAL TIME TAKING CARE OF THIS PATIENT: 35 minutes.  Discussed with HCPOA and pt's niece in room.  POSSIBLE D/C IN 1-2 DAYS, DEPENDING ON CLINICAL CONDITION.   Vaughan Basta M.D on 06/04/2015   Between 7am to 6pm - Pager - [phone]  After 6pm go to [email or web address] - password [email or web address] [facility]  Office  [phone]  CC: Primary care physician; Kirk Ruths., MD  Note: This dictation was prepared with Dragon dictation along with smaller phrase technology. Any transcriptional errors that result from this process are unintentional.

## 2015-06-04 NOTE — Progress Notes (Signed)
Report given to hospice home and IV removed.  Pt ready for transport and EMS called.  Clarise Cruz, RN

## 2015-06-04 NOTE — Discharge Summary (Signed)
[place] at [place] NAME: [name]    MR#:  [id number]  DATE OF BIRTH:  [birth year]  DATE OF ADMISSION:  06/01/2015 ADMITTING PHYSICIAN: Gladstone Lighter, MD  DATE OF DISCHARGE: 06/04/2015  PRIMARY CARE PHYSICIAN: Kirk Ruths., MD    ADMISSION DIAGNOSIS:  Aspiration pneumonia of both lower lobes, unspecified aspiration pneumonia type ([place]) [J69.0]  DISCHARGE DIAGNOSIS:  Active Problems:   Aspiration pneumonia (HCC)   Pressure ulcer   SECONDARY DIAGNOSIS:   Past Medical History  Diagnosis Date  . Mitral valve insufficiency   . Diabetes mellitus without complication ([place])   . Sleep apnea   . Hyperlipidemia   . Hypertension   . History of breast cancer   . Anemia   . Urinary incontinence   . Osteoarthritis   . GERD (gastroesophageal reflux disease)   . History of uterine cancer   . Non-Hodgkin's lymphoma ([place])   . Incontinence     HOSPITAL COURSE:   #1 Aspiration pneumonia- recurrent. Likely from drinking thin liquids per POA given by patients niece over the last 4 days - CXR with bibasilar infiltrates - Blood cultures negative. IV zosyn  - NPO, repeat speech consult- when more alert. - o2 support as needed- on 2L chronic o2 HCPOA agreed on Comfort care with continueing Abx, will continue care accordingly. Hold all oral meds as pt is drowsy. 06/04/15- again had discussion with HCPOA and pt's niece- the both agreed on comfort care and stopping her ABX, and consult hospice. SPoke to pt's nurse and case manager about this arrangement.  #2 hypernatremia-secondary to free water deficit Given IV fluids. now stopped due to concern of fluid overload or edema in lungs.  #3 hypokalemia- replaced   #4 diabetes mellitus- hold oral meds and insulin, stop gluco checks also - as comfort care.  #5 hypertension- held all meds due to lower Blood pressure- required some fluid boluses, now comfort care.  #6  depression-continue Zoloft. Also on Xanax when necessary for anxiety  Now comfort care- morphine for any signs of distress.  #7 DVT prophylaxis-on Lovenox   DISCHARGE CONDITIONS:  Fair.   CONSULTS OBTAINED:     DRUG ALLERGIES:   Allergies  Allergen Reactions  . Adhesive [Tape] Other (See Comments)    Nursing home records...unknown side effect  . Percocet [Oxycodone-Acetaminophen] Other (See Comments)    From nursing home records, unknown side effect    DISCHARGE MEDICATIONS:   Current Discharge Medication List    START taking these medications   Details  morphine 20 MG/5ML solution Take 1.3 mLs (5.2 mg total) by mouth every 2 (two) hours as needed for pain (or signs of distress.). Qty: 100 mL, Refills: 0    scopolamine (TRANSDERM-SCOP) 1 MG/3DAYS Place 1 patch (1.5 mg total) onto the skin every 3 (three) days. Qty: 2 patch, Refills: 12      STOP taking these medications     acetaminophen (TYLENOL) 325 MG tablet      ALPRAZolam (XANAX) 0.25 MG tablet      Calcium Carbonate-Vitamin D (CALCIUM 600+D) 600-400 MG-UNIT per tablet      carbidopa-levodopa (SINEMET IR) 10-100 MG tablet      cloNIDine (CATAPRES) 0.1 MG tablet      glipiZIDE (GLUCOTROL) 5 MG tablet      hydrochlorothiazide (HYDRODIURIL) 25 MG tablet      losartan (COZAAR) 50 MG tablet      meloxicam (MOBIC) 7.5 MG tablet  omeprazole (PRILOSEC) 20 MG capsule      QUEtiapine (SEROQUEL) 25 MG tablet      sertraline (ZOLOFT) 100 MG tablet      vitamin C (ASCORBIC ACID) 250 MG tablet      nystatin (MYCOSTATIN/NYSTOP) 100000 UNIT/GM POWD          DISCHARGE INSTRUCTIONS:    Follow as needed.  If you experience worsening of your admission symptoms, develop shortness of breath, life threatening emergency, suicidal or homicidal thoughts you must seek medical attention immediately by calling 911 or calling your MD immediately  if symptoms less severe.  You Must read complete  instructions/literature along with all the possible adverse reactions/side effects for all the Medicines you take and that have been prescribed to you. Take any new Medicines after you have completely understood and accept all the possible adverse reactions/side effects.   Please note  You were cared for by a hospitalist during your hospital stay. If you have any questions about your discharge medications or the care you received while you were in the hospital after you are discharged, you can call the unit and asked to speak with the hospitalist on call if the hospitalist that took care of you is not available. Once you are discharged, your primary care physician will handle any further medical issues. Please note that NO REFILLS for any discharge medications will be authorized once you are discharged, as it is imperative that you return to your primary care physician (or establish a relationship with a primary care physician if you do not have one) for your aftercare needs so that they can reassess your need for medications and monitor your lab values.    Today   CHIEF COMPLAINT:   Chief Complaint  Patient presents with  . Shortness of Breath    HISTORY OF PRESENT ILLNESS:  Sarah Orozco  is a 82 y.o. female with a known history of dementia, aspiration pneumonia history on dysphagia diet, GERD, hypertension, sleep apnea and diabetes presents from [facility] facility secondary to increased secretions and also difficulty breathing. Patient is not a great historian due to her underlying dementia, most of the history is obtained from her friend/power of attorney at bedside. Patient was in the hospital last month for aspiration pneumonia and was discharged on a dysphagia diet. According to her POA, patient's niece visited her over the weekend and started giving her ginger ale and ice cream per patient's request. For the last couple of days the staff at the nursing home have  noticed that patient was having increased excretions and they started suctioning. Apparently an x-ray was supposed to be done which was not done according to the family member. Patient is presenting with elevated white count, some tachypnea and increased clearing of her throat. Chest x-ray with bibasilar infiltrates noted. She was discharged on 2 L oxygen the last time she was here and is now stable on 2 L. Patient's POA does not want her to go back to [facility].    VITAL SIGNS:  Blood pressure 71/46, pulse 48, temperature 97.6 F (36.4 C), temperature source Oral, resp. rate 22, height 5\' 2"  (1.575 m), weight 80.831 kg (178 lb 3.2 oz), SpO2 64 %.  I/O:  No intake or output data in the 24 hours ending 06/04/15 1335  PHYSICAL EXAMINATION:   GENERAL: 82 y.o.-year-old patient lying in the bed with no acute distress.  EYES: Pupils equal, round, reactive to light and accommodation. No scleral icterus.  HEENT: Head atraumatic, normocephalic. Oropharynx and nasopharynx clear.  NECK: Supple, no jugular venous distention. No thyroid enlargement, no tenderness.  LUNGS: Normal breath sounds bilaterally, mild wheezing,some crepitation.gurgling sounds of breathing. No use of accessory muscles of respiration.  CARDIOVASCULAR: S1, S2 normal. No murmurs, rubs, or gallops.  ABDOMEN: Soft, nontender, nondistended. Bowel sounds present. No organomegaly or mass.  EXTREMITIES: No pedal edema, cyanosis, or clubbing.  NEUROLOGIC: pt is drowsy, not able to check power or sensation, pupils reactive to light, pt does not follow commands. PSYCHIATRIC: The patient is drowsy.  SKIN: No obvious rash, lesion, or ulcer.    DATA REVIEW:   CBC  Recent Labs Lab 06/02/15 0723  WBC 16.6*  HGB 11.5*  HCT 37.2  PLT 178    Chemistries   Recent Labs Lab 06/01/15 1729 06/02/15 0723  NA 156* 154*  K 2.9* 2.6*  CL 109 112*  CO2 35* 33*  GLUCOSE 192* 279*  BUN 45* 52*  CREATININE 1.07*  1.96*  CALCIUM 9.6 8.2*  AST 22  --   ALT 26  --   ALKPHOS 89  --   BILITOT 1.4*  --     Cardiac Enzymes  Recent Labs Lab 06/02/15 0723  TROPONINI 0.11*    Microbiology Results  Results for orders placed or performed during the hospital encounter of 06/01/15  Blood culture (routine x 2)     Status: None (Preliminary result)   Collection Time: 06/01/15  9:18 PM  Result Value Ref Range Status   Specimen Description BLOOD RIGHT ASSIST CONTROL  Final   Special Requests BOTTLES DRAWN AEROBIC AND ANAEROBIC 4CC  Final   Culture NO GROWTH 2 DAYS  Final   Report Status PENDING  Incomplete  Blood culture (routine x 2)     Status: None (Preliminary result)   Collection Time: 06/01/15  9:28 PM  Result Value Ref Range Status   Specimen Description BLOOD RIGHT HAND  Final   Special Requests BOTTLES DRAWN AEROBIC AND ANAEROBIC 4CC  Final   Culture NO GROWTH 2 DAYS  Final   Report Status PENDING  Incomplete  MRSA PCR Screening     Status: None   Collection Time: 06/02/15  2:00 AM  Result Value Ref Range Status   MRSA by PCR NEGATIVE NEGATIVE Final    Comment:        The GeneXpert MRSA Assay (FDA approved for NASAL specimens only), is one component of a comprehensive MRSA colonization surveillance program. It is not intended to diagnose MRSA infection nor to guide or monitor treatment for MRSA infections.     RADIOLOGY:  No results found.  EKG:   Orders placed or performed during the hospital encounter of 04/09/15  . EKG 12-Lead  . EKG 12-Lead      Management plans discussed with the patient, family and they are in agreement.  CODE STATUS:     Code Status Orders        Start     Ordered   06/01/15 2119  Do not attempt resuscitation (DNR)   Continuous    Question Answer Comment  In the event of cardiac or respiratory ARREST Do not call a "code blue"   In the event of cardiac or respiratory ARREST Do not perform Intubation, CPR, defibrillation or ACLS   In the  event of cardiac or respiratory ARREST Use medication by any route, position, wound care, and other measures to relive pain and suffering. May use oxygen, suction and manual treatment of  airway obstruction as needed for comfort.      06/01/15 2118    Advance Directive Documentation        Most Recent Value   Type of Advance Directive  Out of facility DNR (pink MOST or yellow form)   Pre-existing out of facility DNR order (yellow form or pink MOST form)  Yellow form placed in chart (order not valid for inpatient use)   "MOST" Form in Place?        TOTAL TIME TAKING CARE OF THIS PATIENT: 35 minutes.    Vaughan Basta M.D on 06/04/2015 at 1:35 PM  Between 7am to 6pm - Pager - [phone]  After 6pm go to [email or web address] - password [email or web address] [facility]  Office  [phone]  CC: Primary care physician; Kirk Ruths., MD   Note: This dictation was prepared with Dragon dictation along with smaller phrase technology. Any transcriptional errors that result from this process are unintentional.

## 2015-06-04 NOTE — Consult Note (Signed)
WOC wound consult note Reason for Consult: Stage 2 pressure injury to sacrum, present on admission.  Wound type:Stage 2 pressure injury Bilateral boggy heels Pressure Ulcer POA: Yes Measurement: sacrum 2 cm x 2 cm x 0.1 cm  Bilateral heels with blanchable redness will offload with a pillow under her heels.  Wound [id number] 100% pink and moist Drainage (amount, consistency, odor) scant serosanguinous Periwound:intact Heels intact with blanchable redness Dressing procedure/placement/frequency:Cleanse sacral wound with NS and pat dry.  Allevyn silicone border foam dressing.  Change every 5 days and PRN soilage .  Will not follow at this time.  Please re-consult if needed.  Domenic Moras RN BSN [place] Pager [phone]

## 2015-06-04 NOTE — Progress Notes (Signed)
Pt transported to hospice via EMS.  Clarise Cruz, RN

## 2015-06-04 NOTE — Plan of Care (Signed)
Problem: Safety: Goal: Ability to remain free from injury will improve Outcome: Progressing Patient lethargic throughout shift but will respond after repeated stimulation. Patient is high fall risk, call light within reach, but patient not able to understand the use. Bed alarm on throughout shift.   Problem: Pain Managment: Goal: General experience of comfort will improve Outcome: Progressing Patient with frequent vocalizations this shift, but sound asleep upon assessment. Patient is comfort care with continuing antibiotics.  PRN medication given per eMAR. Patient able to rest throughout shift.

## 2015-06-04 NOTE — Clinical Documentation Improvement (Signed)
Hospitalist  Can the diagnosis of Pressure Ulcers be further specified?   Stage 2 pressure ulcer to sacrum present on admission  Other condition  Unable to clinically determine   Supporting Information: :  Documented in progress note of 06/02/14 as pressure ulcer.  Wound consult available on 06/02/15.  Need stage and site please.   Please exercise your independent, professional judgment when responding. A specific answer is not anticipated or expected.   Thank You, [place] [phone]

## 2015-06-04 NOTE — Clinical Documentation Improvement (Signed)
  Hospitalist  Would you please help clarify the specific medical condition related to clinical findings listed below?   Acute Renal Failure/Acute Kidney Injury  Acute Tubular Necrosis  Acute Renal Cortical Necrosis  Acute Renal Medullary Necrosis  Acute on Chronic Renal Failure  Chronic Renal Failure  Other  Clinically Undetermined   Supporting Information: BUN on admission was 45 and then 52 on 06/02/15; Creatinine was 1.07 on admission and 1.96 on 06/02/15.  GFR was 46 on admission and 22 on 06/02/15   Please exercise your independent, professional judgment when responding. A specific answer is not anticipated or expected.   Thank You,  [place] [phone]

## 2015-06-04 NOTE — Clinical Social Work Note (Signed)
Pt discharged to [facility]. CSW is signing off as no further needs identified.   Darden Dates, MSW, LCSW Clinical Social Worker [phone]

## 2015-06-04 NOTE — Care Management (Signed)
Physician's progress notes, speech therapy notes,  Vital signs, laboratory values, and face sheet faxed to Hospice referral intake.  Will review and determine if Hospice Home appropriate. Shelbie Ammons RN MSN CCM Care Management [phone]

## 2015-06-04 NOTE — Care Management (Signed)
Admitted to [facility] with the diagnosis of aspiration pneumonia. Discharged from this facility to [facility] 04/25/15. Dr. Frazier Richards is primary care physician. Friend is David Stall  [phone] or [phone]). Npo. IV Zosyn continues. Sodium level 154 on admission. Lethargic this morning.,  Shelbie Ammons RN MSN CCM Care Management [phone]

## 2015-06-04 NOTE — Care Management (Signed)
Spoke with Malachy Mood at the [facility]. David Stall friend/Power of Oneta Rack will be at the [facility] today at 2:00pm to sign admission paperwork. Dr. Anselm Jungling updated. Transportation will be arranged thru [place] [facility], Caprice Beaver is in agreement with plans Shelbie Ammons RN MSN CCM Care Management [phone]

## 2015-06-04 NOTE — Care Management Important Message (Signed)
Important Message  Patient Details  Name: JEANNEAN PORTEOUS MRN: [id number] Date of Birth: [birth year]   Medicare Important Message Given:  Yes    Juliann Pulse A Alantis Bethune 06/04/2015, 11:33 AM

## 2015-06-04 NOTE — Progress Notes (Signed)
Speech Therapy Note: reviewed chart notes; consulted NSG re: pt's status this morning. Pt remains poorly responsive; did not respond to max. Verbal/tactile stim except to have increased respiratory effort/exhalation noise. Also noted was pt's moderate+ wet respiratory status - gurgly pharyngeal/laryngeal secretions.  Due to pt not awaking at all to max. Verbal/tactile stim, pt is NOT safe for any oral intake. Pt also appears unable to safely manage her own secretions indicating high risk for aspiration. NSG and CM updated. Pt has been made comfort care per NSG report. CM to investigate poc further today.

## 2015-06-07 LAB — CULTURE, BLOOD (ROUTINE X 2)
Culture: NO GROWTH
Culture: NO GROWTH

## 2015-06-11 ENCOUNTER — Ambulatory Visit: Payer: Medicare Other | Admitting: Cardiovascular Disease

## 2015-06-11 IMAGING — CR PELVIS - 1-2 VIEW
1 series · 1 of 1 positions shown · non-contrast
Comparison: CT 02/27/2014

CLINICAL DATA: Fall and landed on right side.  Right hip pain.

EXAM:
PELVIS - 1-2 VIEW

[dxr pelvis ap only]
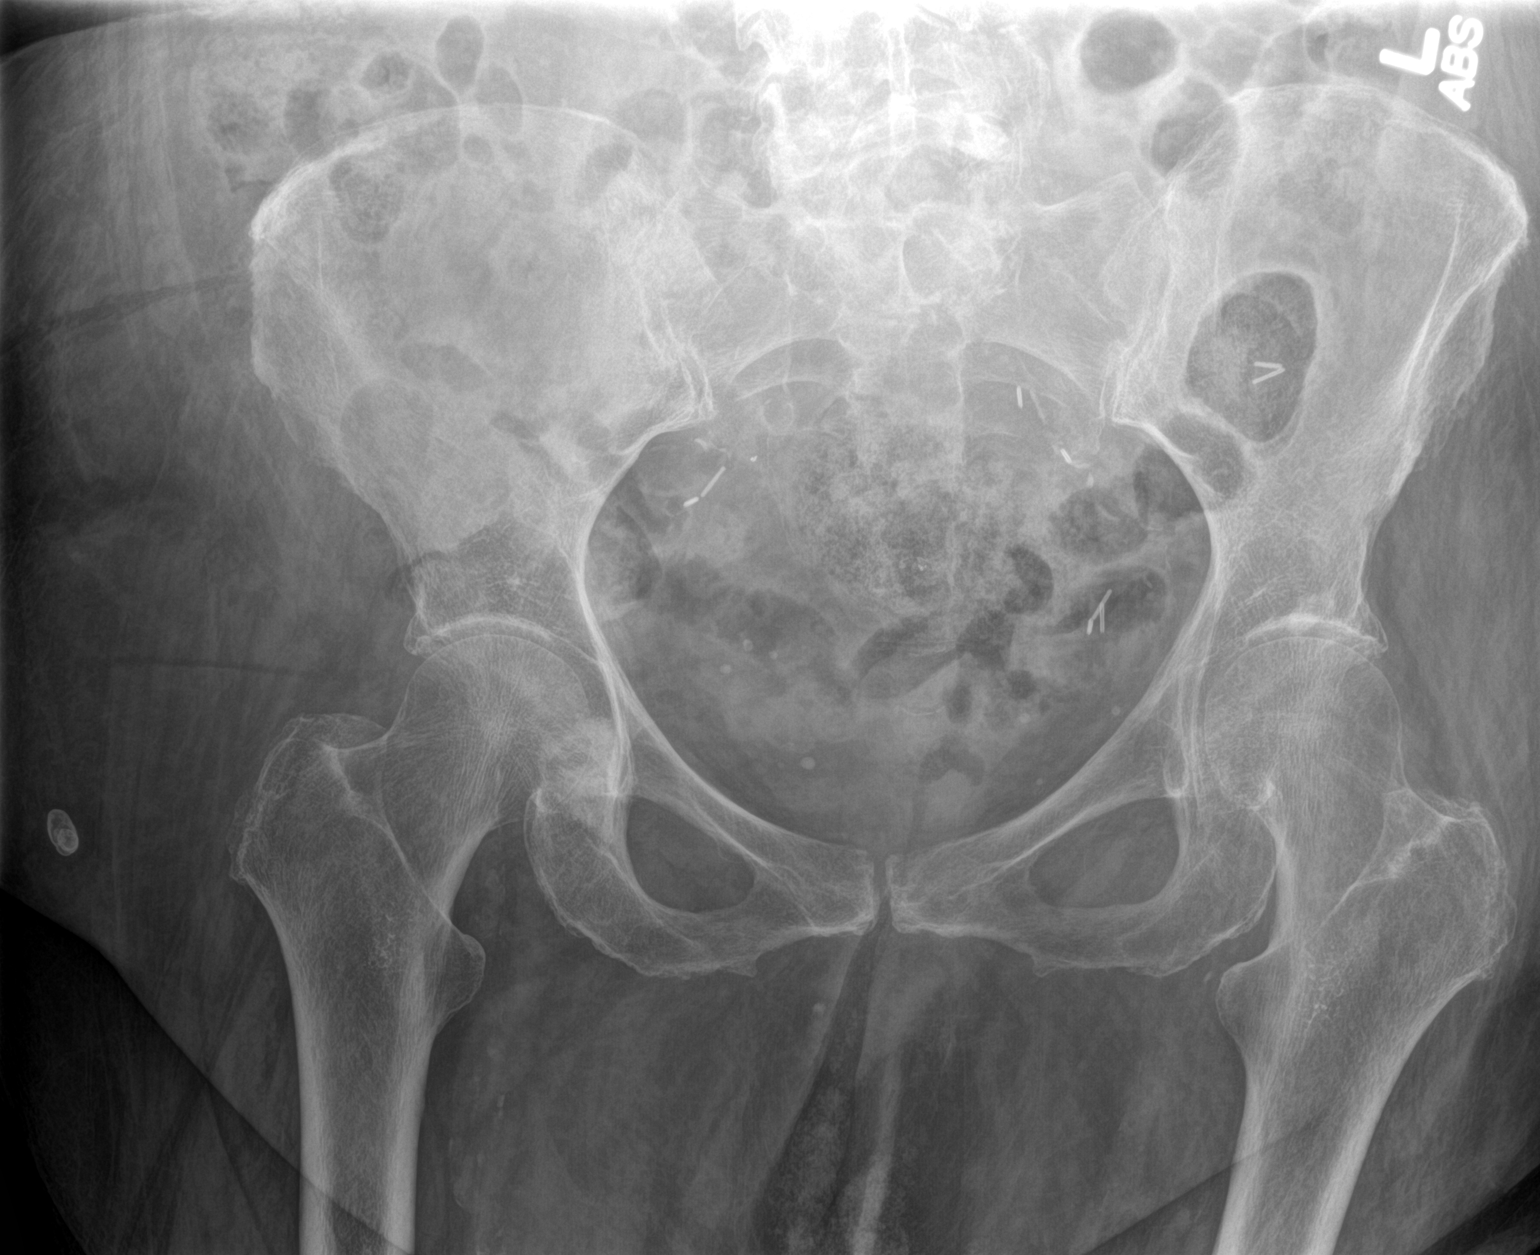

[1 of 1 positions shown; findings below may reference images not displayed]

FINDINGS: There is a stable area of sclerosis involving the posterior right
acetabulum. Surgical clips throughout the pelvis. The pelvic bony
ring is intact. Hips appear to be located on this single view.
Symmetric appearance of the sacroiliac joints.
IMPRESSION: No acute bone abnormality in the pelvis.

## 2015-06-11 IMAGING — CR DG CHEST 1V
1 series · 1 of 1 positions shown · non-contrast
Comparison: None.

CLINICAL DATA: Fall this morning, right side chest pain

EXAM:
CHEST - 1 VIEW

[dxr chest 1 viewap or pa]
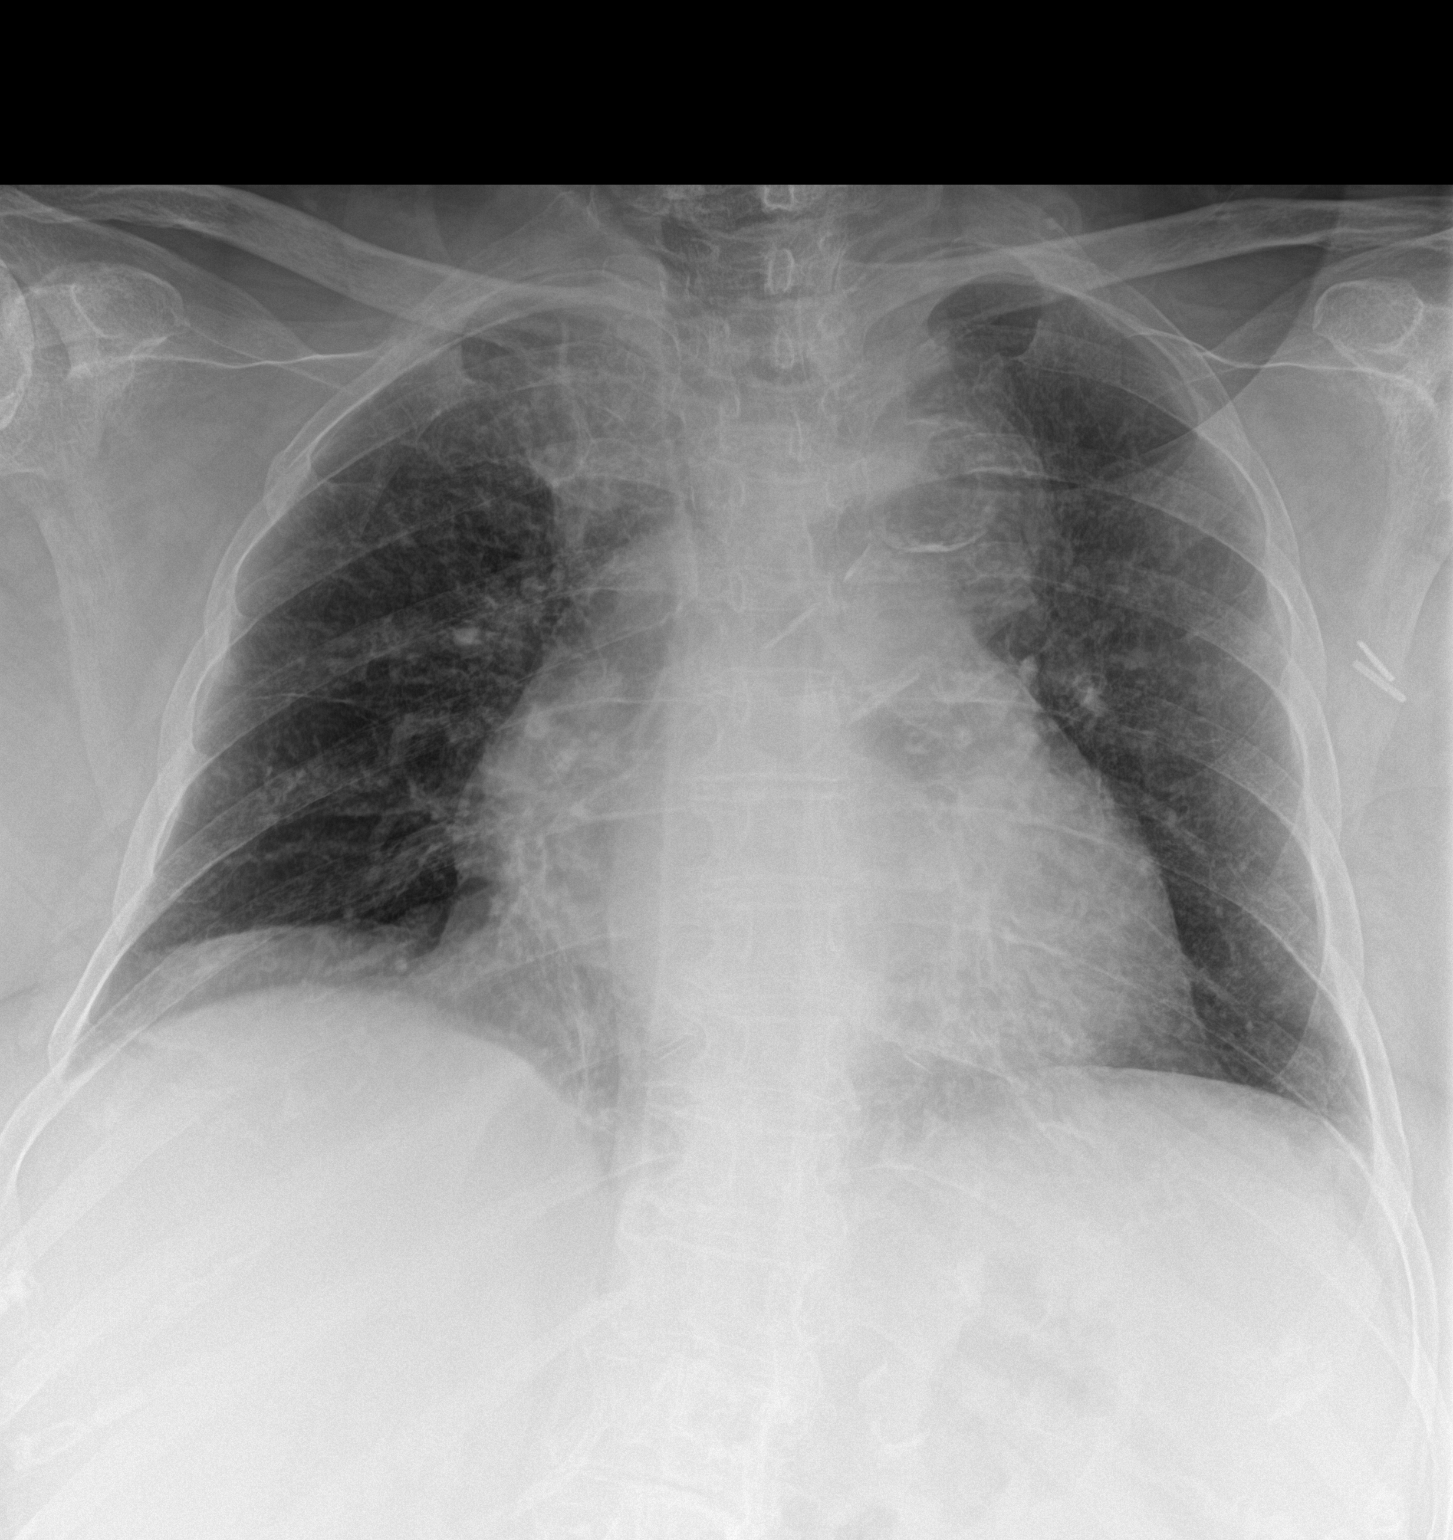

[1 of 1 positions shown; findings below may reference images not displayed]

FINDINGS: Borderline cardiomegaly. No acute infiltrate or pleural effusion. No
pulmonary edema. No pneumothorax. Atherosclerotic calcifications of
thoracic aorta.
IMPRESSION: Borderline cardiomegaly.  No active disease.  No pneumothorax.

## 2015-07-04 DEATH — deceased

## 2016-07-07 IMAGING — CT CT CHEST W/ CM
1 of 2 series · 14 of 31 positions shown, 18 images · IV contrast (omnipaque)
Comparison: 04/11/2015

CLINICAL DATA: Shortness of breath, acute respiratory failure,
possible aspiration pneumonia

EXAM:
CT CHEST WITH CONTRAST
TECHNIQUE: Multidetector CT imaging of the chest was performed during
intravenous contrast administration.
CONTRAST:  75mL OMNIPAQUE IOHEXOL 300 MG/ML  SOLN

[Series 2: routine chest with · axial · 0.77mm/px · z∈[-272,-32]mm · 14 of 58 slices shown, 18 images]
[im 5/58  mediastinal]
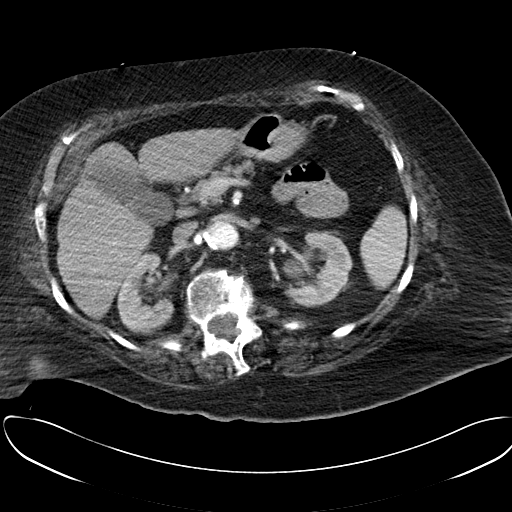
[im 5/58  lung]
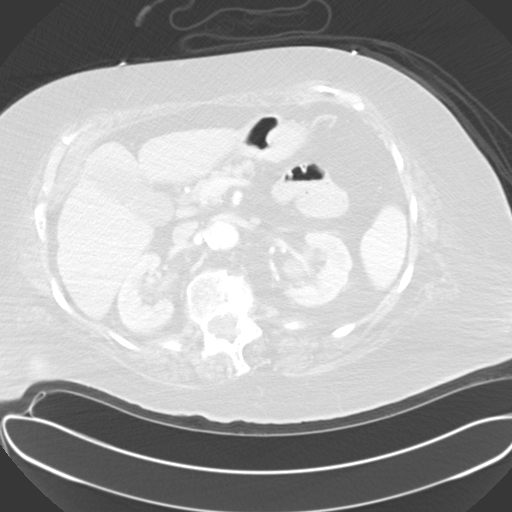
[im 9/58  lung]
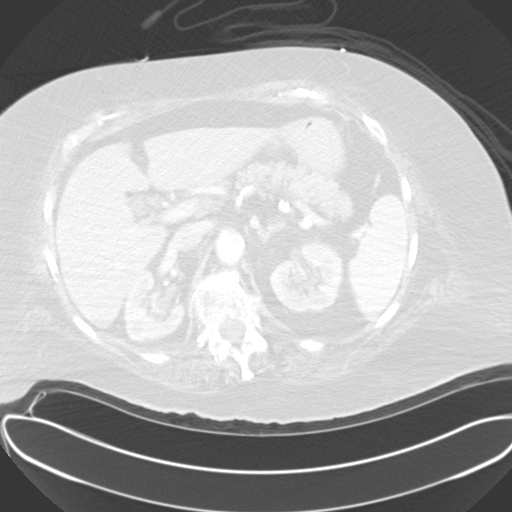
[im 14/58  lung]
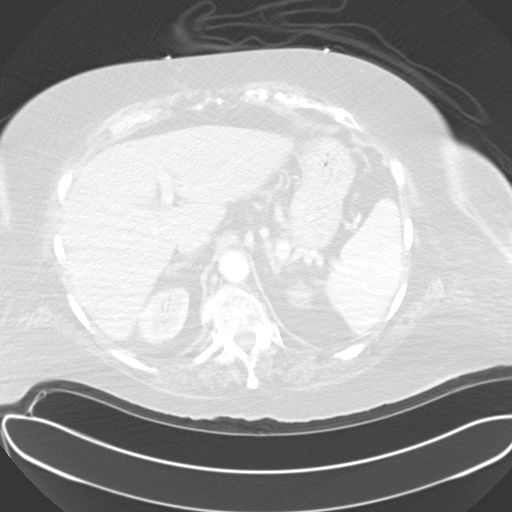
[im 18/58  lung]
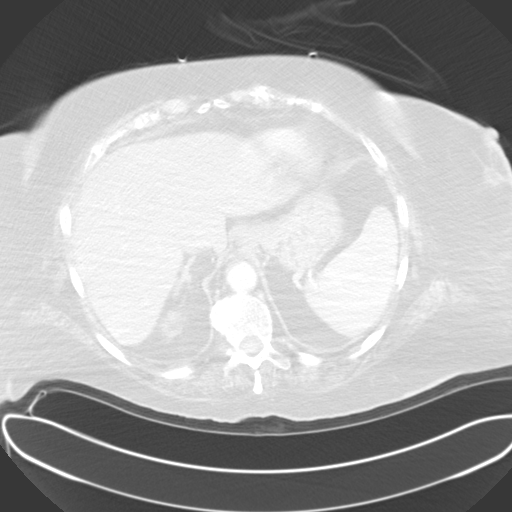
[im 22/58  mediastinal]
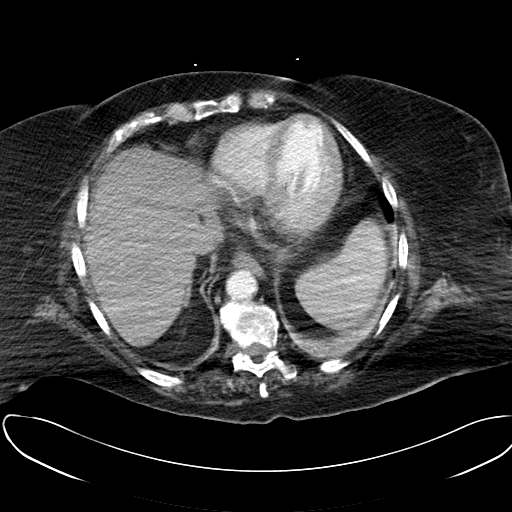
[im 22/58  lung]
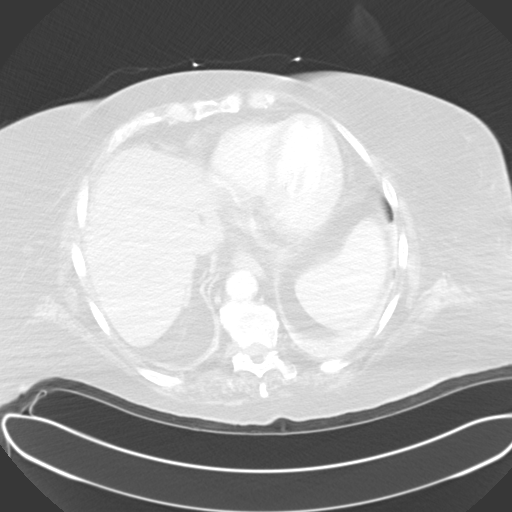
[im 27/58  lung]
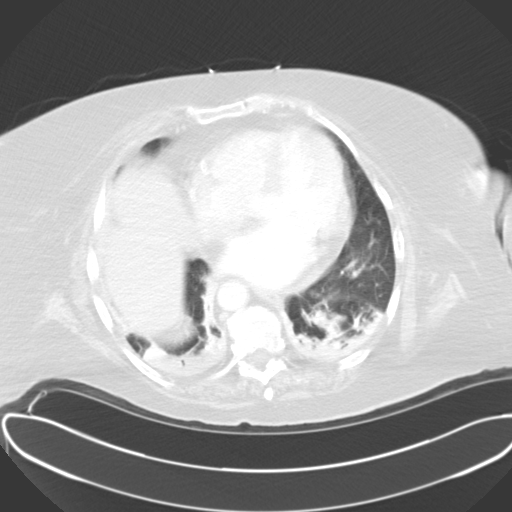
[im 28/58  lung]
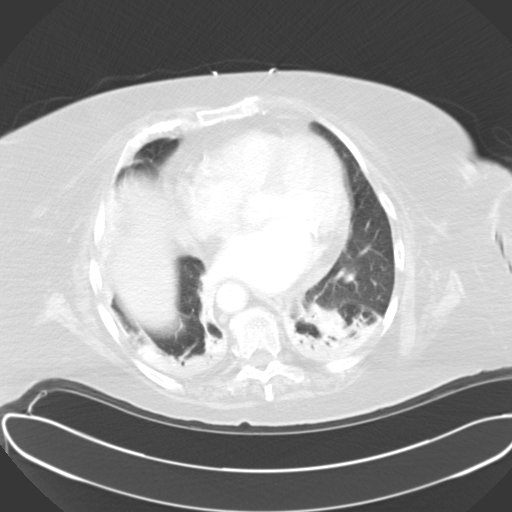
[im 29/58  lung]
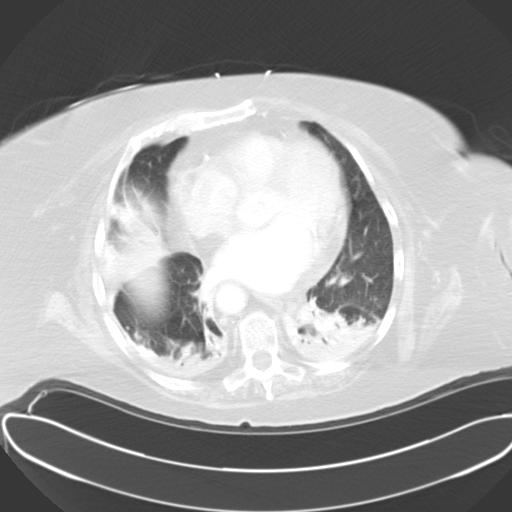
[im 31/58  mediastinal]
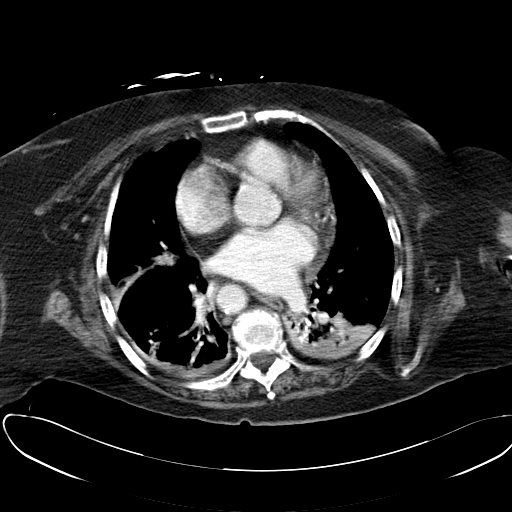
[im 31/58  lung]
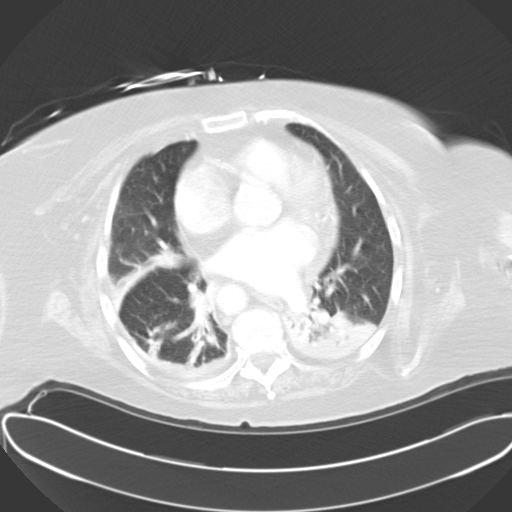
[im 36/58  lung]
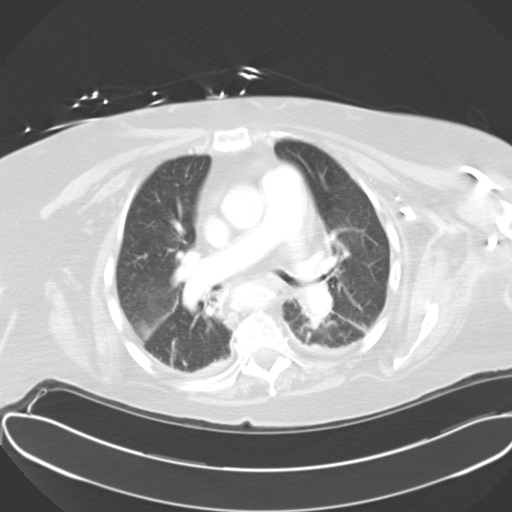
[im 40/58  lung]
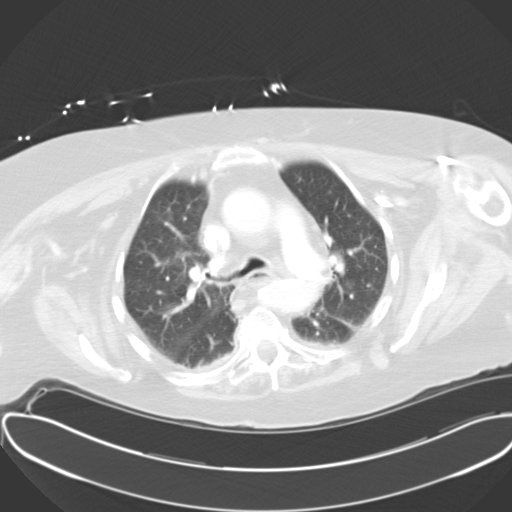
[im 44/58  lung]
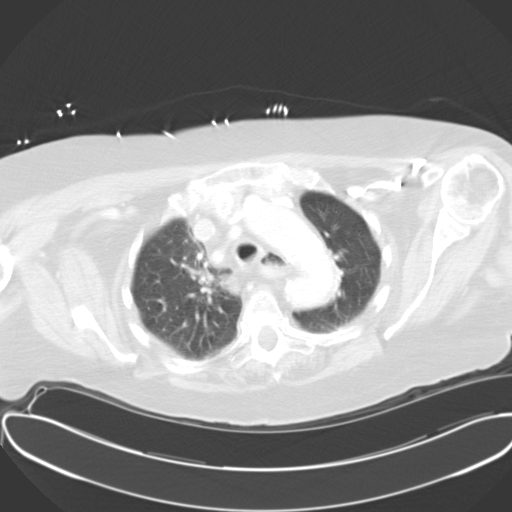
[im 49/58  mediastinal]
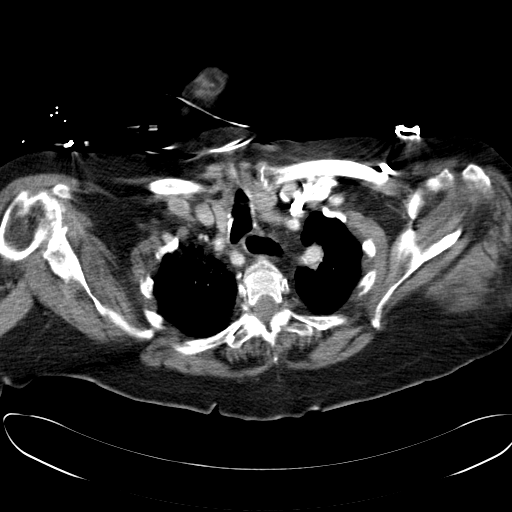
[im 49/58  lung]
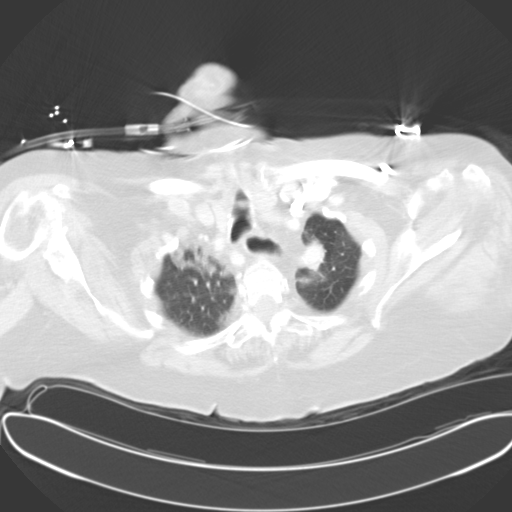
[im 53/58  lung]
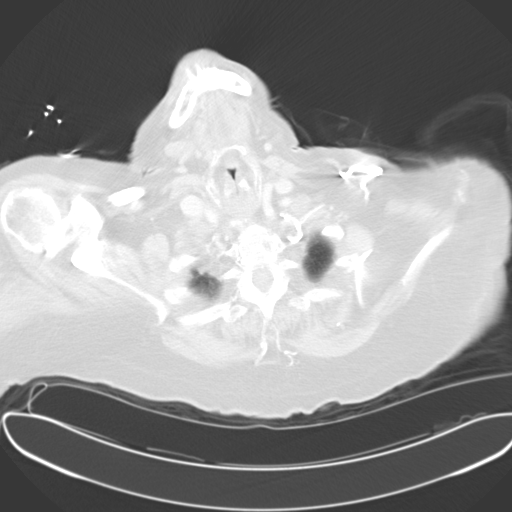

[14 of 31 positions shown; findings below may reference images not displayed]

FINDINGS: Mediastinum/Nodes: Heart is top-normal in size. No pericardial
effusion.

Coronary atherosclerosis.

Atherosclerotic calcifications aortic arch.

No suspicious mediastinal, hilar, or axillary lymphadenopathy.

Visualized thyroid is unremarkable.

Lungs/Pleura: Patchy left lower lobe opacity, suspicious for
pneumonia.

Additional mild patchy opacity in the posterior right middle lobe
and right lower lobe, possibly dependent atelectasis, multifocal
pneumonia not excluded.

Mild patchy/ ground-glass opacity in the anterior right lung apex,
favored to reflect pleural parenchymal scarring, although this
appearance may also reflect additional site of aspiration.

Evaluation of the lung parenchyma is constrained due to respiratory
motion, although no definite pulmonary nodules are visualized.

Underlying mild centrilobular emphysematous changes.

No pleural effusion or pneumothorax.

Upper abdomen: Visualized upper abdomen is notable for a 2.5 cm
gallstone, a 1.3 cm anterior left upper pole renal cyst, and
vascular calcifications.

Musculoskeletal: Degenerative changes of the visualized
thoracolumbar spine.
IMPRESSION: Patchy left lower lobe opacity, suspicious for pneumonia.

Additional mild patchy opacities in the right middle and lower lobes
may reflect multifocal pneumonia (likely on the basis of aspiration)
versus superimposed dependent atelectasis.
